# Patient Record
Sex: Female | Born: 1995 | Race: Black or African American | Hispanic: No | Marital: Single | Smoking: Never smoker
Health system: Southern US, Community
[De-identification: ages and names within clinical notes are randomized; demographics above are authoritative.]

## PROBLEM LIST (undated history)

## (undated) DIAGNOSIS — O24419 Gestational diabetes mellitus in pregnancy, unspecified control: Secondary | ICD-10-CM

## (undated) DIAGNOSIS — J45909 Unspecified asthma, uncomplicated: Secondary | ICD-10-CM

## (undated) DIAGNOSIS — S060XAA Concussion with loss of consciousness status unknown, initial encounter: Secondary | ICD-10-CM

## (undated) DIAGNOSIS — S060X9A Concussion with loss of consciousness of unspecified duration, initial encounter: Secondary | ICD-10-CM

## (undated) HISTORY — PX: NO PAST SURGERIES: SHX2092

---
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
## 2017-01-31 DIAGNOSIS — F1290 Cannabis use, unspecified, uncomplicated: Secondary | ICD-10-CM | POA: Insufficient documentation

## 2017-03-09 DIAGNOSIS — M419 Scoliosis, unspecified: Secondary | ICD-10-CM | POA: Insufficient documentation

## 2017-03-09 DIAGNOSIS — J45909 Unspecified asthma, uncomplicated: Secondary | ICD-10-CM | POA: Insufficient documentation

## 2018-07-06 ENCOUNTER — Other Ambulatory Visit: Payer: Self-pay

## 2018-07-06 ENCOUNTER — Emergency Department (HOSPITAL_COMMUNITY): Payer: No Typology Code available for payment source

## 2018-07-06 ENCOUNTER — Encounter (HOSPITAL_COMMUNITY): Payer: Self-pay | Admitting: Emergency Medicine

## 2018-07-06 ENCOUNTER — Emergency Department (HOSPITAL_COMMUNITY)
Admission: EM | Admit: 2018-07-06 | Discharge: 2018-07-06 | Disposition: A | Discharge status: Home / Self Care | Payer: No Typology Code available for payment source | Attending: Emergency Medicine | Admitting: Emergency Medicine

## 2018-07-06 DIAGNOSIS — Y9389 Activity, other specified: Secondary | ICD-10-CM | POA: Insufficient documentation

## 2018-07-06 DIAGNOSIS — Y929 Unspecified place or not applicable: Secondary | ICD-10-CM | POA: Diagnosis not present

## 2018-07-06 DIAGNOSIS — Y999 Unspecified external cause status: Secondary | ICD-10-CM | POA: Insufficient documentation

## 2018-07-06 DIAGNOSIS — T148XXA Other injury of unspecified body region, initial encounter: Secondary | ICD-10-CM

## 2018-07-06 DIAGNOSIS — S39012A Strain of muscle, fascia and tendon of lower back, initial encounter: Secondary | ICD-10-CM | POA: Insufficient documentation

## 2018-07-06 MED ORDER — METHOCARBAMOL 500 MG PO TABS: 500.0000 mg | ORAL_TABLET | Freq: Two times a day (BID) | ORAL | 0 refills | Status: DC
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
## 2018-07-06 NOTE — ED Notes (Signed)
Pt left for xray

## 2018-07-06 NOTE — ED Notes (Signed)
Patient given discharge instructions and verbalized understanding.  Patient stable to discharge at this time.  Patient is alert and oriented to baseline.  No distressed noted at this time.  All belongings taken with the patient at discharge.   

## 2018-07-06 NOTE — Discharge Instructions (Signed)
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
## 2020-06-19 ENCOUNTER — Other Ambulatory Visit: Payer: Self-pay

## 2020-06-19 ENCOUNTER — Encounter (HOSPITAL_COMMUNITY): Payer: Self-pay

## 2020-06-19 ENCOUNTER — Emergency Department (HOSPITAL_COMMUNITY): Payer: 59

## 2020-06-19 ENCOUNTER — Emergency Department (HOSPITAL_COMMUNITY)
Admission: EM | Admit: 2020-06-19 | Discharge: 2020-06-19 | Disposition: A | Discharge status: Home / Self Care | Payer: 59 | Attending: Emergency Medicine | Admitting: Emergency Medicine

## 2020-06-19 DIAGNOSIS — R42 Dizziness and giddiness: Secondary | ICD-10-CM | POA: Diagnosis not present

## 2020-06-19 DIAGNOSIS — R519 Headache, unspecified: Secondary | ICD-10-CM | POA: Diagnosis not present

## 2020-06-19 DIAGNOSIS — Z5321 Procedure and treatment not carried out due to patient leaving prior to being seen by health care provider: Secondary | ICD-10-CM | POA: Insufficient documentation

## 2020-06-19 DIAGNOSIS — R079 Chest pain, unspecified: Secondary | ICD-10-CM | POA: Diagnosis present

## 2020-06-19 HISTORY — DX: Unspecified asthma, uncomplicated: J45.909

## 2020-06-19 LAB — BASIC METABOLIC PANEL
Anion gap: 11 (ref 5–15)
BUN: 12 mg/dL (ref 6–20)
CO2: 18 mmol/L — ABNORMAL LOW (ref 22–32)
Calcium: 8.9 mg/dL (ref 8.9–10.3)
Chloride: 106 mmol/L (ref 98–111)
Creatinine, Ser: 1 mg/dL (ref 0.44–1.00)
GFR calc Af Amer: >60 mL/min (ref >60)
GFR calc non Af Amer: >60 mL/min (ref >60)
Glucose, Bld: 89 mg/dL (ref 70–99)
Potassium: 3.5 mmol/L (ref 3.5–5.1)
Sodium: 135 mmol/L (ref 135–145)

## 2020-06-19 LAB — CBC
HCT: 38.9 % (ref 36.0–46.0)
Hemoglobin: 13.4 g/dL (ref 12.0–15.0)
MCH: 32.4 pg (ref 26.0–34.0)
MCHC: 34.4 g/dL (ref 30.0–36.0)
MCV: 94.2 fL (ref 80.0–100.0)
Platelets: 187 10*3/uL (ref 150–400)
RBC: 4.13 MIL/uL (ref 3.87–5.11)
RDW: 12.8 % (ref 11.5–15.5)
WBC: 4 10*3/uL (ref 4.0–10.5)
nRBC: 0 % (ref 0.0–0.2)

## 2020-06-19 LAB — TROPONIN I (HIGH SENSITIVITY): Troponin I (High Sensitivity): <2 ng/L (ref <18)

## 2020-06-19 LAB — I-STAT BETA HCG BLOOD, ED (MC, WL, AP ONLY): I-stat hCG, quantitative: <5 m[IU]/mL (ref <5)

## 2020-06-19 NOTE — ED Triage Notes (Signed)
Patient c/o headache, chest pain, and dizziness since last night.

## 2020-06-20 ENCOUNTER — Telehealth: Payer: Self-pay

## 2020-06-20 NOTE — Telephone Encounter (Signed)
Pt wanting lab results. Was in ED yesterday. Labs have not been interpreted- Warm transferred to [facility] operator. Previously advised to ask for ED.

## 2020-06-22 ENCOUNTER — Telehealth (HOSPITAL_COMMUNITY): Payer: Self-pay

## 2020-09-09 ENCOUNTER — Other Ambulatory Visit: Payer: Self-pay

## 2020-09-09 ENCOUNTER — Inpatient Hospital Stay (HOSPITAL_COMMUNITY)
Admission: AD | Admit: 2020-09-09 | Discharge: 2020-09-09 | Disposition: A | Discharge status: Home / Self Care | Payer: 59 | Attending: Obstetrics and Gynecology | Admitting: Obstetrics and Gynecology

## 2020-09-09 ENCOUNTER — Encounter (HOSPITAL_COMMUNITY): Payer: Self-pay | Admitting: Obstetrics and Gynecology

## 2020-09-09 DIAGNOSIS — O219 Vomiting of pregnancy, unspecified: Secondary | ICD-10-CM | POA: Diagnosis present

## 2020-09-09 DIAGNOSIS — Z3A10 10 weeks gestation of pregnancy: Secondary | ICD-10-CM | POA: Diagnosis not present

## 2020-09-09 LAB — URINALYSIS, ROUTINE W REFLEX MICROSCOPIC
Bilirubin Urine: NEGATIVE
Glucose, UA: NEGATIVE mg/dL
Hgb urine dipstick: NEGATIVE
Ketones, ur: NEGATIVE mg/dL
Leukocytes,Ua: NEGATIVE
Nitrite: NEGATIVE
Protein, ur: NEGATIVE mg/dL
Specific Gravity, Urine: 1.024 (ref 1.005–1.030)
pH: 5 (ref 5.0–8.0)

## 2020-09-09 MED ORDER — ONDANSETRON 4 MG PO TBDP: 4.0000 mg | ORAL_TABLET | Freq: Four times a day (QID) | ORAL | 2 refills | Status: DC | PRN

## 2020-09-09 MED ORDER — ONDANSETRON 4 MG PO TBDP
8.0000 mg | ORAL_TABLET | Freq: Once | ORAL | Status: AC
  Administered 2020-09-09: 8 mg via ORAL
  Filled 2020-09-09: qty 2

## 2020-09-09 NOTE — MAU Note (Signed)
Presents with c/o N/V, reports can't keep anything down.  Reports unable hasn't been able to keep anything down for several weeks.  LMP 06/29/2020.  Denies VB.

## 2020-09-09 NOTE — MAU Provider Note (Signed)
History     CSN: [id number]  Arrival date and time: 09/09/20 1145   First Provider Initiated Contact with Patient 09/09/20 1229      Chief Complaint  Patient presents with  . Emesis  . Nausea   Diamond Forbes is a 24 y.o. G2P0 at [redacted]w[redacted]d who presents to MAU with complaints of nausea and emesis. Patient reports that N/V has been occurring for the past several weeks, is currently not on any medication for N/V- patient reports occasionally using THC pen for N/V. Patient reports last use last night. Patient reports 4 occurrences of emesis over the past 24 hours. She reports that she is unable to keep anything down, but reports that she last ate dinner last night. She denies any abdominal pain, vaginal bleeding, or discharge. She reports being seen at [facility] for ultrasound but unable to continue to go due to [facility]. Plans to go to [facility].   OB History    Gravida  2   Para      Term      Preterm      AB  1   Living  0     SAB  1   TAB      Ectopic      Multiple      Live Births  0           Past Medical History:  Diagnosis Date  . Asthma     Past Surgical History:  Procedure Laterality Date  . NO PAST SURGERIES      Family History  Problem Relation Age of Onset  . Healthy Mother   . Healthy Father     Social History   Tobacco Use  . Smoking status: Never Smoker  . Smokeless tobacco: Never Used  Vaping Use  . Vaping Use: Some days  . Substances: THC  Substance Use Topics  . Alcohol use: Not Currently    Comment: Socially  . Drug use: Yes    Types: Marijuana    Comment: last smoked 09/05/20    Allergies:  Allergies  Allergen Reactions  . Keflex [Cephalexin]     Medications Prior to Admission  Medication Sig Dispense Refill Last Dose  . methocarbamol (ROBAXIN) 500 MG tablet Take 1 tablet (500 mg total) by mouth 2 (two) times daily. 20 tablet 0     Review of Systems  Constitutional: Negative.   Respiratory: Negative.    Cardiovascular: Negative.   Gastrointestinal: Positive for nausea and vomiting. Negative for abdominal pain, constipation and diarrhea.  Genitourinary: Negative.   Musculoskeletal: Negative.   Neurological: Negative.   Psychiatric/Behavioral: Negative.    Physical Exam   Blood pressure 122/71, pulse 81, temperature 98.5 F (36.9 C), temperature source Oral, resp. rate 20, height 5\' 5"  (1.651 m), weight 57.6 kg, last menstrual period 06/29/2020, SpO2 100 %.  Physical Exam Vitals and nursing note reviewed.  HENT:     Head: Normocephalic.  Cardiovascular:     Rate and Rhythm: Normal rate and regular rhythm.  Pulmonary:     Effort: Pulmonary effort is normal. No respiratory distress.     Breath sounds: Normal breath sounds. No wheezing.  Abdominal:     General: There is no distension.     Palpations: Abdomen is soft. There is no mass.     Tenderness: There is no abdominal tenderness. There is no guarding.  Skin:    General: Skin is warm and dry.  Neurological:     Mental Status: She  is alert and oriented to person, place, and time.  Psychiatric:        Mood and Affect: Mood normal.        Behavior: Behavior normal.        Thought Content: Thought content normal.    FHR 155 by doppler   MAU Course  Procedures  MDM Results for orders placed or performed during the hospital encounter of 09/09/20 (from the past 24 hour(s))  Urinalysis, Routine w reflex microscopic Urine, Clean Catch     Status: Abnormal   Collection Time: 09/09/20 12:00 PM  Result Value Ref Range   Color, Urine YELLOW YELLOW   APPearance CLOUDY (A) CLEAR   Specific Gravity, Urine 1.024 1.005 - 1.030   pH 5.0 5.0 - 8.0   Glucose, UA NEGATIVE NEGATIVE mg/dL   Hgb urine dipstick NEGATIVE NEGATIVE   Bilirubin Urine NEGATIVE NEGATIVE   Ketones, ur NEGATIVE NEGATIVE mg/dL   Protein, ur NEGATIVE NEGATIVE mg/dL   Nitrite NEGATIVE NEGATIVE   Leukocytes,Ua NEGATIVE NEGATIVE   UA negative for ketones- no IV  hydration ordered . Zofran ODT ordered.  Reassessment after medication - patient able to tolerate gingerale and graham crackers after zofran.   Discussed reasons to return to MAU. Follow up as scheduled in the office. Return to MAU as needed. Pt stable at time of discharge. Rx for zofran sent to pharmacy of choice.   Assessment and Plan   1. Nausea and vomiting in pregnancy prior to [redacted] weeks gestation   2. [redacted] weeks gestation of pregnancy    Discharge home Follow up as scheduled in the office for prenatal care Return to MAU as needed for reasons discussed and/or emergencies  Rx for zofran    Follow-up Information    [facility] Follow up.   Specialty: Obstetrics and Gynecology Contact information: [address] [place] [address] [phone]             Allergies as of 09/09/2020      Reactions   Keflex [cephalexin]       Medication List    STOP taking these medications   methocarbamol 500 MG tablet Commonly known as: ROBAXIN     TAKE these medications   ondansetron 4 MG disintegrating tablet Commonly known as: Zofran ODT Take 1 tablet (4 mg total) by mouth every 6 (six) hours as needed for nausea or vomiting.       Sharyon Cable CNM 09/09/2020, 1:33 PM

## 2020-09-17 ENCOUNTER — Encounter: Payer: Self-pay | Admitting: Family Medicine

## 2020-09-17 ENCOUNTER — Other Ambulatory Visit (HOSPITAL_COMMUNITY)
Admission: RE | Admit: 2020-09-17 | Discharge: 2020-09-17 | Disposition: A | Discharge status: Home / Self Care | Payer: 59 | Source: Ambulatory Visit | Attending: Family Medicine | Admitting: Family Medicine

## 2020-09-17 ENCOUNTER — Ambulatory Visit (INDEPENDENT_AMBULATORY_CARE_PROVIDER_SITE_OTHER): Payer: 59 | Admitting: Family Medicine

## 2020-09-17 ENCOUNTER — Other Ambulatory Visit: Payer: Self-pay

## 2020-09-17 VITALS — BP 118/78 | HR 89 | Wt 128.0 lb

## 2020-09-17 DIAGNOSIS — Z3A11 11 weeks gestation of pregnancy: Secondary | ICD-10-CM | POA: Diagnosis not present

## 2020-09-17 DIAGNOSIS — Z3480 Encounter for supervision of other normal pregnancy, unspecified trimester: Secondary | ICD-10-CM

## 2020-09-17 LAB — OB RESULTS CONSOLE GC/CHLAMYDIA: Gonorrhea: NEGATIVE

## 2020-09-17 MED ORDER — ONDANSETRON 4 MG PO TBDP: 4.0000 mg | ORAL_TABLET | Freq: Three times a day (TID) | ORAL | 2 refills | Status: DC | PRN

## 2020-09-17 NOTE — Progress Notes (Signed)
  Subjective:  Diamond Forbes is a G2P0010 [redacted]w[redacted]d being seen today for her first obstetrical visit.  Her obstetrical history is significant for first pregnancy. FOB involved. Unexpected, but desired . Patient does intend to breast feed. Pregnancy history fully reviewed.  Patient reports nausea.  BP 118/78   Pulse 89   Wt 128 lb (58.1 kg)   LMP 06/29/2020   BMI 21.30 kg/m   HISTORY: OB History  Gravida Para Term Preterm AB Living  2       1 0  SAB TAB Ectopic Multiple Live Births  1       0    # Outcome Date GA Lbr Len/2nd Weight Sex Delivery Anes PTL Lv  2 Current           1 SAB             Past Medical History:  Diagnosis Date  . Asthma     Past Surgical History:  Procedure Laterality Date  . NO PAST SURGERIES      Family History  Problem Relation Age of Onset  . Healthy Mother   . Healthy Father      Exam  BP 118/78   Pulse 89   Wt 128 lb (58.1 kg)   LMP 06/29/2020   BMI 21.30 kg/m   Chaperone present during exam  CONSTITUTIONAL: Well-developed, well-nourished female in no acute distress.  HENT:  Normocephalic, atraumatic, External right and left ear normal. Oropharynx is clear and moist EYES: Conjunctivae and EOM are normal. Pupils are equal, round, and reactive to light. No scleral icterus.  NECK: Normal range of motion, supple, no masses.  Normal thyroid.  CARDIOVASCULAR: Normal heart rate noted, regular rhythm RESPIRATORY: Clear to auscultation bilaterally. Effort and breath sounds normal, no problems with respiration noted. BREASTS: Symmetric in size. No masses, skin changes, nipple drainage, or lymphadenopathy. ABDOMEN: Soft, normal bowel sounds, no distention noted.  No tenderness, rebound or guarding.  PELVIC: Normal appearing external genitalia; normal appearing vaginal mucosa and cervix. No abnormal discharge noted. Normal uterine size, no other palpable masses, no uterine or adnexal tenderness. MUSCULOSKELETAL: Normal range of motion.  No tenderness.  No cyanosis, clubbing, or edema.  2+ distal pulses. SKIN: Skin is warm and dry. No rash noted. Not diaphoretic. No erythema. No pallor. NEUROLOGIC: Alert and oriented to person, place, and time. Normal reflexes, muscle tone coordination. No cranial nerve deficit noted. PSYCHIATRIC: Normal mood and affect. Normal behavior. Normal judgment and thought content.    Assessment:    Pregnancy: G2P0010 Patient Active Problem List   Diagnosis Date Noted  . Supervision of other normal pregnancy, antepartum 09/17/2020      Plan:   1. Supervision of other normal pregnancy, antepartum Discussed Panorama - will think about this. [place] scheduled. Discussed delivery at [facility] hospital Discussed midwives, fellows. - [place] MFM OB COMP + 14 WK; Future - [facility] AMB BABYSCRIPTS OPT IN - Cytology - PAP( [place])  2. [redacted] weeks gestation of pregnancy - [place] MFM OB COMP + 14 WK; Future - [facility] AMB BABYSCRIPTS OPT IN - Cytology - PAP( [place])     Problem list reviewed and updated. 75% of 30 min visit spent on counseling and coordination of care.     Levie Heritage 09/17/2020

## 2020-09-17 NOTE — Progress Notes (Signed)
DATING AND VIABILITY SONOGRAM   Vanissa Nichols-Stephens is a 24 y.o. year old G2P0010 with LMP Patient's last menstrual period was 06/29/2020. which would correlate to  [redacted]w[redacted]d weeks gestation.  She has regular menstrual cycles.   She is here today for a confirmatory initial sonogram.    GESTATION: SINGLETON  FETAL ACTIVITY:          Heart rate      160          The fetus is active.  ADNEXA: The ovaries are normal.   GESTATIONAL AGE AND  BIOMETRICS:  Gestational criteria: Estimated Date of Delivery: 04/05/21 by LMP now at [redacted]w[redacted]d  Previous Scans:0      CROWN RUMP LENGTH           3.80 cm         10-6weeks                                                                               AVERAGE EGA(BY THIS SCAN):  10-6 weeks  WORKING EDD( LMP ):  04-05-2021     TECHNICIAN COMMENTS: Patient informed that the ultrasound is considered a limited obstetric ultrasound and is not intended to be a complete ultrasound exam. Patient also informed that the ultrasound is not being completed with the intent of assessing for fetal or placental anomalies or any pelvic abnormalities. Explained that the purpose of today's ultrasound is to assess for fetal heart rate. Patient acknowledges the purpose of the exam and the limitations of the study.      Armandina Stammer 09/17/2020 10:46 AM

## 2020-09-21 LAB — CYTOLOGY - PAP
Chlamydia: NEGATIVE
Comment: NEGATIVE
Comment: NORMAL
Diagnosis: NEGATIVE
Neisseria Gonorrhea: NEGATIVE

## 2020-10-20 ENCOUNTER — Other Ambulatory Visit: Payer: Self-pay

## 2020-10-20 ENCOUNTER — Encounter: Payer: Self-pay | Admitting: Advanced Practice Midwife

## 2020-10-20 ENCOUNTER — Ambulatory Visit (INDEPENDENT_AMBULATORY_CARE_PROVIDER_SITE_OTHER): Payer: 59 | Admitting: Advanced Practice Midwife

## 2020-10-20 DIAGNOSIS — Z3480 Encounter for supervision of other normal pregnancy, unspecified trimester: Secondary | ICD-10-CM

## 2020-10-20 NOTE — Patient Instructions (Signed)
Genetic Testing During Pregnancy Genetic testing during pregnancy is also called prenatal genetic testing. This type of testing can determine if your baby is at risk of being born with a disorder caused by abnormal genes or chromosomes (genetic disorder). Chromosomes contain genes that control how your baby will develop in your womb. There are many different genetic disorders. Examples of genetic disorders that may be found through genetic testing include Down syndrome and cystic fibrosis. Gene changes (mutations) can be passed down through families. Genetic testing is offered to all women before or during pregnancy. You can choose whether to have genetic testing. Why is genetic testing done? Genetic testing is done during pregnancy to find out whether your child is at risk for a genetic disorder. Having genetic testing allows you to:  Discuss your test results and options with a genetic counselor.  Prepare for a baby that may be born with a genetic disorder. Learning about the disorder ahead of time helps you be better prepared to manage it. Your health care providers can also be prepared in case your baby requires special care before or after birth.  Consider whether you want to continue with the pregnancy. In some cases, genetic testing may be done to learn about the traits a child will inherit. Types of genetic tests There are two basic types of genetic testing. Screening tests indicate whether your developing baby (fetus) is at higher risk for a genetic disorder. Diagnostic tests check actual fetal cells to diagnose a genetic disorder. Screening tests     Screening tests will not harm your baby. They are recommended for all pregnant women. Types of screening tests include:  Carrier screening. This test involves checking genes from both parents by testing their blood or saliva. The test checks to find out if the parents carry a genetic mutation that may be passed to a baby. In most cases,  both parents must carry the mutation for a baby to be at risk.  First trimester screening. This test combines a blood test with sound wave imaging of your baby (fetal ultrasound). This screening test checks for a risk of Down syndrome or other defects caused by having extra chromosomes. It also checks for defects of the heart, abdomen, or skeleton.  Second trimester screening also combines a blood test with a fetal ultrasound exam. It checks for a risk of genetic defects of the face, brain, spine, heart, or limbs.  Combined or sequential screening. This type of testing combines the results of first and second trimester screening. This type of testing may be more accurate than first or second trimester screening alone.  Cell-free DNA testing. This is a blood test that detects cells released by the placenta that get into the mother's blood. It can be used to check for a risk of Down syndrome, other extra chromosome syndromes, and disorders caused by abnormal numbers of sex chromosomes. This test can be done any time after 10 weeks of pregnancy.  Diagnostic tests Diagnostic tests carry slight risks of problems, including bleeding, infection, and loss of the pregnancy. These tests are done only if your baby is at risk for a genetic disorder. You may meet with a genetic counselor to discuss the risks and benefits before having diagnostic tests. Examples of diagnostic tests include:  Chorionic villus sampling (CVS). This involves a procedure to remove and test a sample of cells taken from the placenta. The procedure may be done between 10 and 12 weeks of pregnancy.  Amniocentesis. This involves a   procedure to remove and test a sample of fluid (amniotic fluid) and cells from the sac that surrounds the developing baby. The procedure may be done between 15 and 20 weeks of pregnancy. What do the results mean? For a screening test:  If the results are negative, it often means that your child is not at higher  risk. There is still a slight chance your child could have a genetic disorder.  If the results are positive, it does not mean your child will have a genetic disorder. It may mean that your child has a higher-than-normal risk for a genetic disorder. In that case, you may want to talk with a genetic counselor about whether you should have diagnostic genetic tests. For a diagnostic test:  If the result is negative, it is unlikely that your child will have a genetic disorder.  If the test is positive for a genetic disorder, it is likely that your child will have the disorder. The test may not tell how severe the disorder will be. Talk with your health care provider about your options. Questions to ask your health care provider Before talking to your health care provider about genetic testing, find out if there is a history of genetic disorders in your family. It may also help to know your family's ethnic origins. Then ask your health care provider the following questions:  Is my baby at risk for a genetic disorder?  What are the benefits of having genetic screening?  What tests are best for me and my baby?  What are the risks of each test?  If I get a positive result on a screening test, what is the next step?  Should I meet with a genetic counselor before having a diagnostic test?  Should my partner or other members of my family be tested?  How much do the tests cost? Will my insurance cover the testing? Summary  Genetic testing is done during pregnancy to find out whether your child is at risk for a genetic disorder.  Genetic testing is offered to all women before or during pregnancy. You can choose whether to have genetic testing.  There are two basic types of genetic testing. Screening tests indicate whether your developing baby (fetus) is at higher risk for a genetic disorder. Diagnostic tests check actual fetal cells to diagnose a genetic disorder.  If a diagnostic genetic test is  positive, talk with your health care provider about your options. This information is not intended to replace advice given to you by your health care provider. Make sure you discuss any questions you have with your health care provider. Document Revised: 02/14/2019 Document Reviewed: 01/08/2018 Elsevier Patient Education  2020 [employer] Inc[employer]  

## 2020-10-20 NOTE — Addendum Note (Signed)
Addended by: Anell Barr on: 10/20/2020 11:22 AM   Modules accepted: Orders

## 2020-10-20 NOTE — Progress Notes (Signed)
   PRENATAL VISIT NOTE  Subjective:  Diamond Forbes is a 24 y.o. G2P0010 at [redacted]w[redacted]d being seen today for ongoing prenatal care.  She is currently monitored for the following issues for this low-risk pregnancy and has Supervision of other normal pregnancy, antepartum; Marijuana use; Mild asthma; and Scoliosis on their problem list.  Patient reports no complaints.  Contractions: Not present. Vag. Bleeding: None.  Movement: Absent. Denies leaking of fluid.   The following portions of the patient's history were reviewed and updated as appropriate: allergies, current medications, past family history, past medical history, past social history, past surgical history and problem list.   Objective:   Vitals:   10/20/20 1008  BP: 113/69  Pulse: 78  Weight: 138 lb (62.6 kg)    Fetal Status:     Movement: Absent     General:  Alert, oriented and cooperative. Patient is in no acute distress.  Skin: Skin is warm and dry. No rash noted.   Cardiovascular: Normal heart rate noted  Respiratory: Normal respiratory effort, no problems with respiration noted  Abdomen: Soft, gravid, appropriate for gestational age.  Pain/Pressure: Absent     Pelvic: Cervical exam deferred        Extremities: Normal range of motion.  Edema: None  Mental Status: Normal mood and affect. Normal behavior. Normal judgment and thought content.   Assessment and Plan:  Pregnancy: G2P0010 at [redacted]w[redacted]d 1. Supervision of other normal pregnancy, antepartum     Decided to do Panorama test     Anatomy US scheduled for 11/12/20 - Urine Culture - CBC/D/Plt+RPR+Rh+ABO+Rub Ab... - Genetic Screening  Preterm labor symptoms and general obstetric precautions including but not limited to vaginal bleeding, contractions, leaking of fluid and fetal movement were reviewed in detail with the patient. Please refer to After Visit Summary for other counseling recommendations.   Return in about 4 weeks (around 11/17/2020) for [facility].  Future Appointments  Date Time Provider Department Center  11/12/2020 10:45 AM [facility] US5 [facility]  11/17/2020  9:30 AM Aviva Signs, CNM [facility] None    Wynelle Bourgeois, CNM

## 2020-10-21 LAB — CBC/D/PLT+RPR+RH+ABO+RUB AB...
Antibody Screen: NEGATIVE
Basophils Absolute: 0 10*3/uL (ref 0.0–0.2)
Basos: 0 %
EOS (ABSOLUTE): 0.2 10*3/uL (ref 0.0–0.4)
Eos: 4 %
HCV Ab: <0.1 s/co ratio (ref 0.0–0.9)
HIV Screen 4th Generation wRfx: NONREACTIVE
Hematocrit: 35.8 % (ref 34.0–46.6)
Hemoglobin: 12.1 g/dL (ref 11.1–15.9)
Hepatitis B Surface Ag: NEGATIVE
Immature Grans (Abs): 0 10*3/uL (ref 0.0–0.1)
Immature Granulocytes: 1 %
Lymphocytes Absolute: 1.4 10*3/uL (ref 0.7–3.1)
Lymphs: 28 %
MCH: 31.7 pg (ref 26.6–33.0)
MCHC: 33.8 g/dL (ref 31.5–35.7)
MCV: 94 fL (ref 79–97)
Monocytes Absolute: 0.4 10*3/uL (ref 0.1–0.9)
Monocytes: 8 %
Neutrophils Absolute: 2.8 10*3/uL (ref 1.4–7.0)
Neutrophils: 59 %
Platelets: 188 10*3/uL (ref 150–450)
RBC: 3.82 x10E6/uL (ref 3.77–5.28)
RDW: 12.9 % (ref 11.7–15.4)
RPR Ser Ql: NONREACTIVE
Rh Factor: POSITIVE
Rubella Antibodies, IGG: 2.71 index (ref >0.99)
WBC: 4.8 10*3/uL (ref 3.4–10.8)

## 2020-10-21 LAB — HCV INTERPRETATION

## 2020-11-07 NOTE — L&D Delivery Note (Signed)
Patient: Diamond Forbes MRN: [id number]  GBS status: Negative, IAP given: None   Patient is a 25 y.o. now G2P1 s/p NSVD at [redacted]w[redacted]d, who was admitted for IOL for A1GDM. AROM 2h 30m prior to delivery with clear fluid.    Delivery Note At 3:16 PM a viable female was delivered via Vaginal, Spontaneous (Presentation: Right Occiput Anterior).  APGAR: 7, 8; weight pending.   Placenta status: Spontaneous, Intact.  Cord: 3 vessels with the following complications: None.    Head delivered ROA. No nuchal cord present. At this point, shoulder dystocia was noted. Shoulder dystocia lasted one minute. McRobert's and suprapubic pressure were applied. Was able to hook left anterior shoulder and sweep for delivery. Infant with spontaneous cry, placed on mother's abdomen, dried and bulb suctioned. Cord clamped x 2 after 2-minute delay, and cut by family member. Cord blood drawn. Placenta delivered spontaneously with gentle cord traction. Fundus noted to be boggy. Pitocin was started followed by TXA and Methergine. A lower uterine sweep was performed, yielding several large clots. Fundus firm with these interventions. Perineum inspected and found to have small first degree laceration, which was found to be hemostatic.  Anesthesia: Epidural Episiotomy: None Lacerations: First degree perineal  Suture Repair: N/A Est. Blood Loss (mL): 700  Mom to postpartum.  Baby to Couplet care / Skin to Skin.  De Hollingshead 03/23/2021, 4:19 PM

## 2020-11-12 ENCOUNTER — Ambulatory Visit: Payer: 59

## 2020-11-13 ENCOUNTER — Ambulatory Visit: Payer: 59 | Attending: Family

## 2020-11-13 DIAGNOSIS — Z23 Encounter for immunization: Secondary | ICD-10-CM

## 2020-11-17 ENCOUNTER — Encounter: Payer: Self-pay | Admitting: Advanced Practice Midwife

## 2020-11-17 ENCOUNTER — Other Ambulatory Visit: Payer: Self-pay

## 2020-11-17 ENCOUNTER — Ambulatory Visit (INDEPENDENT_AMBULATORY_CARE_PROVIDER_SITE_OTHER): Payer: 59 | Admitting: Advanced Practice Midwife

## 2020-11-17 VITALS — BP 110/68 | HR 78 | Wt 148.0 lb

## 2020-11-17 DIAGNOSIS — Z3480 Encounter for supervision of other normal pregnancy, unspecified trimester: Secondary | ICD-10-CM

## 2020-11-17 DIAGNOSIS — Z3A20 20 weeks gestation of pregnancy: Secondary | ICD-10-CM

## 2020-11-17 NOTE — Progress Notes (Signed)
   PRENATAL VISIT NOTE  Subjective:  Diamond Forbes is a 25 y.o. G2P0010 at [redacted]w[redacted]d being seen today for ongoing prenatal care.  She is currently monitored for the following issues for this low-risk pregnancy and has Supervision of other normal pregnancy, antepartum; Marijuana use; Mild asthma; and Scoliosis on their problem list.  Patient reports no complaints.  Contractions: Not present. Vag. Bleeding: None.  Movement: Present. Denies leaking of fluid.   The following portions of the patient's history were reviewed and updated as appropriate: allergies, current medications, past family history, past medical history, past social history, past surgical history and problem list.   Objective:   Vitals:   11/17/20 0945  BP: 110/68  Pulse: 78  Weight: 148 lb (67.1 kg)    Fetal Status: Fetal Heart Rate (bpm): 148   Movement: Present     General:  Alert, oriented and cooperative. Patient is in no acute distress.  Skin: Skin is warm and dry. No rash noted.   Cardiovascular: Normal heart rate noted  Respiratory: Normal respiratory effort, no problems with respiration noted  Abdomen: Soft, gravid, appropriate for gestational age.  Pain/Pressure: Absent     Pelvic: Cervical exam deferred        Extremities: Normal range of motion.  Edema: None  Mental Status: Normal mood and affect. Normal behavior. Normal judgment and thought content.   Assessment and Plan:  Pregnancy: G2P0010 at [redacted]w[redacted]d 1. Supervision of other normal pregnancy, antepartum      Got Covid Vaccine last week (school requirement)      Sore arm kept her up that night      Feeling fetal movement now.  2. 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 Please refer to After Visit Summary for other counseling recommendations.   Return in about 4 weeks (around 12/15/2020) for [facility].  Future Appointments  Date Time Provider Department Center  11/27/2020  3:45 PM [facility] US4 [facility]  12/15/2020  9:35 AM Aviva Signs, CNM [facility] None    Wynelle Bourgeois, CNM

## 2020-11-17 NOTE — Patient Instructions (Signed)

## 2020-11-27 ENCOUNTER — Ambulatory Visit: Payer: 59 | Attending: Family Medicine

## 2020-11-27 ENCOUNTER — Other Ambulatory Visit: Payer: Self-pay

## 2020-11-27 DIAGNOSIS — Z3A11 11 weeks gestation of pregnancy: Secondary | ICD-10-CM | POA: Diagnosis present

## 2020-11-27 DIAGNOSIS — Z3480 Encounter for supervision of other normal pregnancy, unspecified trimester: Secondary | ICD-10-CM | POA: Diagnosis present

## 2020-12-15 ENCOUNTER — Other Ambulatory Visit: Payer: Self-pay

## 2020-12-15 ENCOUNTER — Ambulatory Visit (INDEPENDENT_AMBULATORY_CARE_PROVIDER_SITE_OTHER): Payer: 59 | Admitting: Advanced Practice Midwife

## 2020-12-15 VITALS — BP 110/72 | HR 89 | Wt 162.0 lb

## 2020-12-15 DIAGNOSIS — Z3480 Encounter for supervision of other normal pregnancy, unspecified trimester: Secondary | ICD-10-CM

## 2020-12-15 DIAGNOSIS — O1202 Gestational edema, second trimester: Secondary | ICD-10-CM

## 2020-12-15 NOTE — Progress Notes (Signed)
   PRENATAL VISIT NOTE  Subjective:  Diamond Forbes is a 25 y.o. G2P0010 at [redacted]w[redacted]d being seen today for ongoing prenatal care.  She is currently monitored for the following issues for this low-risk pregnancy and has Supervision of other normal pregnancy, antepartum; Marijuana use; Mild asthma; and Scoliosis on their problem list.  Patient reports Feet swelling due to prolonged standing at work, having trouble walking long distances at school.  Contractions: Not present. Vag. Bleeding: None.  Movement: Present. Denies leaking of fluid.   The following portions of the patient's history were reviewed and updated as appropriate: allergies, current medications, past family history, past medical history, past social history, past surgical history and problem list.   Objective:   Vitals:   12/15/20 0951  BP: 110/72  Pulse: 89  Weight: 162 lb (73.5 kg)    Fetal Status: Fetal Heart Rate (bpm): 150   Movement: Present     General:  Alert, oriented and cooperative. Patient is in no acute distress.  Skin: Skin is warm and dry. No rash noted.   Cardiovascular: Normal heart rate noted  Respiratory: Normal respiratory effort, no problems with respiration noted  Abdomen: Soft, gravid, appropriate for gestational age.  Pain/Pressure: Present     Pelvic: Cervical exam deferred        Extremities: Normal range of motion.  Edema: None  Mental Status: Normal mood and affect. Normal behavior. Normal judgment and thought content.   Assessment and Plan:  Pregnancy: G2P0010 at [redacted]w[redacted]d 1. Supervision of other normal pregnancy, antepartum Letter given to provide for changing positions at work, allow for breaks as needed Filled out form for temporary (33mo) parking pass for school Glucose tolerance test next visit  2. Edema during pregnancy in second trimester Discussed compression socks to wear daily  Preterm labor symptoms and general obstetric precautions including but not limited to vaginal  bleeding, contractions, leaking of fluid and fetal movement were reviewed in detail with the patient. Please refer to After Visit Summary for other counseling recommendations.   No follow-ups on file.  Future Appointments  Date Time Provider Department Center  01/14/2021  8:30 AM Levie Heritage, DO [facility] None  01/28/2021 10:00 AM Levie Heritage, DO [facility] None    Wynelle Bourgeois, CNM

## 2020-12-27 ENCOUNTER — Other Ambulatory Visit: Payer: Self-pay

## 2020-12-27 ENCOUNTER — Inpatient Hospital Stay (HOSPITAL_COMMUNITY)
Admission: AD | Admit: 2020-12-27 | Discharge: 2020-12-27 | Disposition: A | Discharge status: Home / Self Care | Payer: 59 | Attending: Obstetrics & Gynecology | Admitting: Obstetrics & Gynecology

## 2020-12-27 ENCOUNTER — Encounter (HOSPITAL_COMMUNITY): Payer: Self-pay | Admitting: Obstetrics & Gynecology

## 2020-12-27 DIAGNOSIS — O368120 Decreased fetal movements, second trimester, not applicable or unspecified: Secondary | ICD-10-CM

## 2020-12-27 DIAGNOSIS — Z3689 Encounter for other specified antenatal screening: Secondary | ICD-10-CM

## 2020-12-27 DIAGNOSIS — Z881 Allergy status to other antibiotic agents status: Secondary | ICD-10-CM | POA: Diagnosis not present

## 2020-12-27 DIAGNOSIS — Z3A25 25 weeks gestation of pregnancy: Secondary | ICD-10-CM | POA: Diagnosis not present

## 2020-12-27 NOTE — Discharge Instructions (Signed)
How a Baby Grows During Pregnancy Pregnancy begins when a female's sperm enters a female's egg. This is called fertilization. Fertilization usually happens in one of the fallopian tubes that connect the ovaries to the uterus. The fertilized egg moves down the fallopian tube to the uterus. Once it reaches the uterus, it implants into the lining of the uterus and begins to grow. For the first 8 weeks, the fertilized egg is called an embryo. After 8 weeks, it is called a fetus. As the fetus continues to grow, it receives oxygen and nutrients through the placenta, which is an organ that grows to support the developing baby. The placenta is the life support system for the baby. It provides oxygen and nutrition and removes waste. How long does a typical pregnancy last? A pregnancy usually lasts 280 days, or about 40 weeks. Pregnancy is divided into three periods of growth, also called trimesters:  First trimester: 0-12 weeks.  Second trimester: 13-27 weeks.  Third trimester: 28-40 weeks. The day when your baby is ready to be born (full term) is your estimated date of delivery. However, most babies are not born on their estimated date of delivery. How does my baby develop month by month? First month  The fertilized egg attaches to the inside of the uterus.  Some cells will form the placenta. Others will form the fetus.  The arms, legs, brain, spinal cord, lungs, and heart begin to develop.  At the end of the first month, the heart begins to beat. Second month  The bones, inner ear, eyelids, hands, and feet form.  The genitals develop.  By the end of 8 weeks, all major organs are developing. Third month  All of the internal organs are forming.  Teeth develop below the gums.  Bones and muscles begin to grow. The spine can flex.  The skin is transparent.  Fingernails and toenails begin to form.  Arms and legs continue to grow longer, and hands and feet develop.  The fetus is about 3  inches (7.6 cm) long. Fourth month  The placenta is completely formed.  The external sex organs, neck, outer ear, eyebrows, eyelids, and fingernails are formed.  The fetus can hear, swallow, and move its arms and legs.  The kidneys begin to produce urine.  The skin is covered with a white, waxy coating (vernix) and very fine hair (lanugo). Fifth month  The fetus moves around more and can be felt for the first time (quickening).  The fetus starts to sleep and wake up and may begin to suck a finger.  The nails grow to the end of the fingers.  The organ in the digestive system that makes bile (gallbladder) functions and helps to digest nutrients.  If the fetus is a female, eggs are present in the ovaries. If the fetus is a female, testicles start to move down into the scrotum. Sixth month  The lungs are formed.  The eyes open. The brain continues to develop.  Your baby has fingerprints and toe prints. Your baby's hair grows thicker.  At the end of the second trimester, the fetus is about 9 inches (22.9 cm) long. Seventh month  The fetus kicks and stretches.  The eyes are developed enough to sense changes in light.  The hands can make a grasping motion.  The fetus responds to sound. Eighth month  Most organs and body systems are fully developed and functioning.  Bones harden, and taste buds develop. The fetus may hiccup.  Certain areas  of the brain are still developing. The skull remains soft. Ninth month  The fetus gains about  lb (0.23 kg) each week.  The lungs are fully developed.  Patterns of sleep develop.  The fetus's head typically moves into a head-down position (vertex) in the uterus to prepare for birth.  The fetus weighs 6-9 lb (2.72-4.08 kg) and is 19-20 inches (48.26-50.8 cm) long.   How do I know if my baby is developing well? Always talk with your health care provider about any concerns that you may have about your pregnancy and your baby. At each  prenatal visit, your health care provider will do several different tests to check on your health and keep track of your baby's development. These include:  Fundal height and position. To do this, your health care provider will: ? Measure your growing belly from your pubic bone to the top of the uterus using a tape measure. ? Feel your belly to determine your baby's position.  Heartbeat. An ultrasound in the first trimester can confirm pregnancy and show a heartbeat, depending on how far along you are. Your health care provider will check your baby's heart rate at every prenatal visit. You will also have a second trimester ultrasound to check your baby's development. Follow these instructions at home:  Take prenatal vitamins as told by your health care provider. These include vitamins such as folic acid, iron, calcium, and vitamin D. They are important for healthy development.  Take over-the-counter and prescription medicines only as told by your health care provider.  Keep all follow-up visits. This is important. Follow-up visits include prenatal care and screening tests. Summary  A pregnancy usually lasts 280 days, or about 40 weeks. Pregnancy is divided into three periods of growth, also called trimesters.  Your health care provider will monitor your baby's growth and development throughout your pregnancy.  Follow your health care provider's recommendations about taking prenatal vitamins and medicines during your pregnancy.  Talk with your health care provider if you have any concerns about your pregnancy or your developing baby. This information is not intended to replace advice given to you by your health care provider. Make sure you discuss any questions you have with your health care provider. Document Revised: 04/01/2020 Document Reviewed: 02/06/2020 Elsevier Patient Education  2021 [employer].

## 2020-12-27 NOTE — MAU Provider Note (Signed)
History     CSN: [id number]  Arrival date and time: 12/27/20 4235   None     Chief Complaint  Patient presents with  . Decreased Fetal Movement   HPI Patient Diamond Forbes is a 25 y.o. G2P0010  at [redacted]w[redacted]d here with complaints of decreased fetal movements. She denies bleeding, LOF, abnormal discharge.  She denies contractions, fever, SOB, dysuria.  She reports decreased fetal movements since yesterday morning which lasted into the day and overnight.  She was concerned because she tried making her move this morning: she tried playing with belly, pushing on her stomach.  She tried drinking water and eating, and she didn't move after patient was eating.   Patient called OB nurse line and the nurse told her to come in for evaluation.    OB History    Gravida  2   Para      Term      Preterm      AB  1   Living  0     SAB  1   IAB      Ectopic      Multiple      Live Births  0           Past Medical History:  Diagnosis Date  . Asthma     Past Surgical History:  Procedure Laterality Date  . NO PAST SURGERIES      Family History  Problem Relation Age of Onset  . Healthy Mother   . Healthy Father     Social History   Tobacco Use  . Smoking status: Never Smoker  . Smokeless tobacco: Never Used  Vaping Use  . Vaping Use: Some days  . Substances: THC  Substance Use Topics  . Alcohol use: Not Currently    Comment: Socially  . Drug use: Yes    Types: Marijuana    Comment: last smoked 09/05/20    Allergies:  Allergies  Allergen Reactions  . Cephalexin Diarrhea and Rash    "severe diarrhea"    Medications Prior to Admission  Medication Sig Dispense Refill Last Dose  . cyclobenzaprine (FLEXERIL) 5 MG tablet Take by mouth.     . ondansetron (ZOFRAN ODT) 4 MG disintegrating tablet Take 1 tablet (4 mg total) by mouth every 8 (eight) hours as needed for nausea or vomiting. 60 tablet 2   . Prenatal Vit-Fe Fumarate-FA (PRENATAL  VITAMINS PO) Take by mouth.     . valACYclovir (VALTREX) 500 MG tablet Take 500 mg by mouth daily.       Review of Systems  Constitutional: Negative.   HENT: Negative.   Respiratory: Negative.   Cardiovascular: Negative.   Gastrointestinal: Negative for abdominal pain.  Genitourinary: Negative.   Musculoskeletal: Negative.   Neurological: Negative.    Physical Exam   Blood pressure 110/66, pulse 99, temperature 98.7 F (37.1 C), temperature source Oral, resp. rate 16, last menstrual period 06/29/2020.  Physical Exam Constitutional:      Appearance: Normal appearance. She is normal weight.  Abdominal:     General: Abdomen is flat.  Musculoskeletal:        General: Normal range of motion.  Skin:    General: Skin is warm.  Neurological:     General: No focal deficit present.     Mental Status: She is alert.  Psychiatric:        Mood and Affect: Mood normal.        Behavior: Behavior normal.  MAU Course  Procedures  MDM -NST; 150 bpm, mod var, present acel, no decels, uterine irratability -patient felt strong fetal movements while in MAU, no other complaints.  -No other work-up done while in MAU.  Assessment and Plan   1. NST (non-stress test) reactive    -Keep appt on 3-10 for glucose test -return to MAU if her condition changes; reviewed drinking cold drinks with sugar and assessing movements, although fetal kick counts are not relaible at this gestation.   [name] 12/27/2020, 10:02 AM

## 2020-12-27 NOTE — MAU Note (Signed)
Pt reports to mau with c/o dfm for the past few days.  Pt denies LOF, vag bleeding or ctx at this time.

## 2021-01-14 ENCOUNTER — Other Ambulatory Visit: Payer: Self-pay

## 2021-01-14 ENCOUNTER — Ambulatory Visit (INDEPENDENT_AMBULATORY_CARE_PROVIDER_SITE_OTHER): Payer: Medicaid Other | Admitting: Family Medicine

## 2021-01-14 ENCOUNTER — Other Ambulatory Visit (HOSPITAL_COMMUNITY)
Admission: RE | Admit: 2021-01-14 | Discharge: 2021-01-14 | Disposition: A | Discharge status: Home / Self Care | Payer: Medicaid Other | Source: Ambulatory Visit | Attending: Family Medicine | Admitting: Family Medicine

## 2021-01-14 VITALS — BP 104/69 | HR 89 | Wt 173.0 lb

## 2021-01-14 DIAGNOSIS — N898 Other specified noninflammatory disorders of vagina: Secondary | ICD-10-CM | POA: Diagnosis present

## 2021-01-14 DIAGNOSIS — O26893 Other specified pregnancy related conditions, third trimester: Secondary | ICD-10-CM | POA: Diagnosis not present

## 2021-01-14 DIAGNOSIS — Z3A28 28 weeks gestation of pregnancy: Secondary | ICD-10-CM

## 2021-01-14 DIAGNOSIS — Z3480 Encounter for supervision of other normal pregnancy, unspecified trimester: Secondary | ICD-10-CM

## 2021-01-14 DIAGNOSIS — D508 Other iron deficiency anemias: Secondary | ICD-10-CM

## 2021-01-14 LAB — CERVICOVAGINAL ANCILLARY ONLY
Bacterial Vaginitis (gardnerella): NEGATIVE
Candida Glabrata: NEGATIVE
Candida Vaginitis: POSITIVE — AB
Comment: NEGATIVE
Comment: NEGATIVE
Comment: NEGATIVE

## 2021-01-14 LAB — OB RESULTS CONSOLE RPR: RPR: NONREACTIVE

## 2021-01-14 LAB — OB RESULTS CONSOLE HIV ANTIBODY (ROUTINE TESTING): HIV: NONREACTIVE

## 2021-01-14 NOTE — Progress Notes (Signed)
   PRENATAL VISIT NOTE  Subjective:  Diamond Forbes is a 25 y.o. G2P0010 at [redacted]w[redacted]d being seen today for ongoing prenatal care.  She is currently monitored for the following issues for this low-risk pregnancy and has Supervision of other normal pregnancy, antepartum; Marijuana use; Mild asthma; and Scoliosis on their problem list.  Patient reports vaginal irritation.  Contractions: Not present. Vag. Bleeding: None.  Movement: Present. Denies leaking of fluid.   The following portions of the patient's history were reviewed and updated as appropriate: allergies, current medications, past family history, past medical history, past social history, past surgical history and problem list.   Objective:   Vitals:   01/14/21 0835  BP: 104/69  Pulse: 89  Weight: 173 lb (78.5 kg)    Fetal Status: Fetal Heart Rate (bpm): 159 Fundal Height: 28 cm Movement: Present     General:  Alert, oriented and cooperative. Patient is in no acute distress.  Skin: Skin is warm and dry. No rash noted.   Cardiovascular: Normal heart rate noted  Respiratory: Normal respiratory effort, no problems with respiration noted  Abdomen: Soft, gravid, appropriate for gestational age.  Pain/Pressure: Absent     Pelvic: Cervical exam deferred        Extremities: Normal range of motion.  Edema: Trace  Mental Status: Normal mood and affect. Normal behavior. Normal judgment and thought content.   Assessment and Plan:  Pregnancy: G2P0010 at [redacted]w[redacted]d 1. [redacted] weeks gestation of pregnancy  2. Supervision of other normal pregnancy, antepartum FHT and FH normal  3. Vaginal discharge during pregnancy in third trimester Self swab today.  Preterm labor symptoms and general obstetric precautions including but not limited to vaginal bleeding, contractions, leaking of fluid and fetal movement were reviewed in detail with the patient. Please refer to After Visit Summary for other counseling recommendations.   No follow-ups on  file.  Future Appointments  Date Time Provider Department Center  01/28/2021 10:00 AM Levie Heritage, DO [facility] None    Levie Heritage, DO

## 2021-01-15 LAB — CBC
Hematocrit: 28.5 % — ABNORMAL LOW (ref 34.0–46.6)
Hemoglobin: 9.4 g/dL — ABNORMAL LOW (ref 11.1–15.9)
MCH: 29.1 pg (ref 26.6–33.0)
MCHC: 33 g/dL (ref 31.5–35.7)
MCV: 88 fL (ref 79–97)
Platelets: 229 10*3/uL (ref 150–450)
RBC: 3.23 x10E6/uL — ABNORMAL LOW (ref 3.77–5.28)
RDW: 13.1 % (ref 11.7–15.4)
WBC: 5.8 10*3/uL (ref 3.4–10.8)

## 2021-01-15 LAB — GLUCOSE TOLERANCE, 2 HOURS W/ 1HR
Glucose, 1 hour: 141 mg/dL (ref 65–179)
Glucose, 2 hour: 122 mg/dL (ref 65–152)
Glucose, Fasting: 93 mg/dL — ABNORMAL HIGH (ref 65–91)

## 2021-01-15 LAB — HIV ANTIBODY (ROUTINE TESTING W REFLEX): HIV Screen 4th Generation wRfx: NONREACTIVE

## 2021-01-15 LAB — RPR: RPR Ser Ql: NONREACTIVE

## 2021-01-19 ENCOUNTER — Other Ambulatory Visit: Payer: Self-pay

## 2021-01-19 MED ORDER — TERCONAZOLE 0.4 % VA CREA: 1.0000 | TOPICAL_CREAM | Freq: Every day | VAGINAL | 0 refills | Status: AC

## 2021-01-19 NOTE — Telephone Encounter (Signed)
Patient given results of 28 week labs and vaginal culture.  Patient given treatment per protocol for yeast infection.  Will route to provider to review gtt. Patient states that her and Dr. Adrian Blackwater discussed that she had pancakes about eight hours before her lab draw. Patient had only fasting number elevated. Will route to provider to review. Armandina Stammer, RN  01/19/2021 3:59 PM

## 2021-01-20 MED ORDER — ACCU-CHEK SOFTCLIX LANCETS MISC: 1.0000 | Freq: Four times a day (QID) | 12 refills | Status: DC

## 2021-01-20 MED ORDER — ACCU-CHEK NANO SMARTVIEW W/DEVICE KIT
1.0000 | PACK | 0 refills | Status: DC
Start: 2021-01-20 — End: 2021-03-25

## 2021-01-20 MED ORDER — ACCU-CHEK SMARTVIEW VI STRP: ORAL_STRIP | 12 refills | Status: DC

## 2021-01-20 NOTE — Addendum Note (Signed)
Addended by: Levie Heritage on: 01/20/2021 03:26 PM   Modules accepted: Orders, SmartSet

## 2021-01-22 ENCOUNTER — Telehealth: Payer: Self-pay

## 2021-01-22 NOTE — Telephone Encounter (Signed)
-----   Message from Levie Heritage, DO sent at 01/20/2021  3:21 PM EDT ----- Fasting elevated - will need to check CBGs at least for 2 weeks. Testing supplies sent to pharmacy. Additionally, is quite anemic and orders placed for IV iron (venofer weekly for 3 doses). Please notify patient.

## 2021-01-22 NOTE — Telephone Encounter (Signed)
Called pt to discuss GTT and CBC results. Pt made aware that her fasting glucose was elevated and she will need to check CBG f or at least 2 weeks.Pt also made aware that she is anemic and IV iron will be scheduled for her. Understanding was voiced. Latana Colin l Pegah Segel, CMA

## 2021-01-25 ENCOUNTER — Other Ambulatory Visit: Payer: Self-pay | Admitting: Family Medicine

## 2021-01-28 ENCOUNTER — Ambulatory Visit (INDEPENDENT_AMBULATORY_CARE_PROVIDER_SITE_OTHER): Payer: Medicaid Other | Admitting: Family Medicine

## 2021-01-28 ENCOUNTER — Other Ambulatory Visit: Payer: Self-pay

## 2021-01-28 VITALS — BP 112/72 | HR 93 | Wt 173.0 lb

## 2021-01-28 DIAGNOSIS — Z3A30 30 weeks gestation of pregnancy: Secondary | ICD-10-CM

## 2021-01-28 DIAGNOSIS — J4520 Mild intermittent asthma, uncomplicated: Secondary | ICD-10-CM

## 2021-01-28 DIAGNOSIS — D508 Other iron deficiency anemias: Secondary | ICD-10-CM

## 2021-01-28 DIAGNOSIS — R7301 Impaired fasting glucose: Secondary | ICD-10-CM

## 2021-01-28 DIAGNOSIS — Z3480 Encounter for supervision of other normal pregnancy, unspecified trimester: Secondary | ICD-10-CM

## 2021-01-28 MED ORDER — ALBUTEROL SULFATE HFA 108 (90 BASE) MCG/ACT IN AERS: 2.0000 | INHALATION_SPRAY | Freq: Four times a day (QID) | RESPIRATORY_TRACT | 2 refills | Status: DC | PRN

## 2021-01-28 NOTE — Progress Notes (Signed)
   PRENATAL VISIT NOTE  Subjective:  Diamond Forbes is a 25 y.o. G2P0010 at [redacted]w[redacted]d being seen today for ongoing prenatal care.  She is currently monitored for the following issues for this low-risk pregnancy and has Supervision of other normal pregnancy, antepartum; Marijuana use; Mild asthma; and Scoliosis on their problem list.  Patient reports fatigue - takes a couple of naps during the day and still tired. Having some SOB/Chest tightness. Doesn't have albuterol inhaler.  Contractions: Not present. Vag. Bleeding: None.  Movement: Present. Denies leaking of fluid.   The following portions of the patient's history were reviewed and updated as appropriate: allergies, current medications, past family history, past medical history, past social history, past surgical history and problem list.   Objective:   Vitals:   01/28/21 1007  BP: 112/72  Pulse: 93  Weight: 173 lb (78.5 kg)    Fetal Status:   Fundal Height: 30 cm Movement: Present     General:  Alert, oriented and cooperative. Patient is in no acute distress.  Skin: Skin is warm and dry. No rash noted.   Cardiovascular: Normal heart rate noted  Respiratory: Normal respiratory effort, no problems with respiration noted  Abdomen: Soft, gravid, appropriate for gestational age.  Pain/Pressure: Present     Pelvic: Cervical exam deferred        Extremities: Normal range of motion.  Edema: Trace  Mental Status: Normal mood and affect. Normal behavior. Normal judgment and thought content.   Assessment and Plan:  Pregnancy: G2P0010 at [redacted]w[redacted]d 1. [redacted] weeks gestation of pregnancy  2. Supervision of other normal pregnancy, antepartum FHT and FH normal  3. Mild intermittent asthma without complication Start albuterol prn  4. Elevated fasting blood sugar Patient states that she had pancakes about 8 hours prior to 2hr GTT. Discussed that this wouldn't change the test. Recommended at least checking fastings for the next two weeks.  Fasting CBG today is 86.  5. Iron deficiency anemia secondary to inadequate dietary iron intake Iron infusions arranged.   Preterm labor symptoms and general obstetric precautions including but not limited to vaginal bleeding, contractions, leaking of fluid and fetal movement were reviewed in detail with the patient. Please refer to After Visit Summary for other counseling recommendations.   Return in about 2 weeks (around 02/11/2021) for OB f/u.  Future Appointments  Date Time Provider Department Center  02/02/2021  8:00 AM [facility] None  02/11/2021 10:15 AM Levie Heritage, DO [facility] None  02/25/2021 10:00 AM Levie Heritage, DO [facility] None  03/11/2021 10:00 AM Levie Heritage, DO [facility] None  03/18/2021 10:00 AM Levie Heritage, DO [facility] None  03/24/2021 10:00 AM Levie Heritage, DO [facility] None  04/01/2021 10:30 AM Willodean Rosenthal, MD [facility] None    Levie Heritage, DO

## 2021-01-28 NOTE — Progress Notes (Signed)
Patient complaining of fatigue. Armandina Stammer RN   Patient states she would like to not check blood sugars. She had pancakes at midnight the night before the blood sugar test and her fasting blood sugar was elevated by two points. Armandina Stammer RN

## 2021-02-02 ENCOUNTER — Other Ambulatory Visit: Payer: Self-pay

## 2021-02-02 ENCOUNTER — Encounter (HOSPITAL_COMMUNITY)
Admission: RE | Admit: 2021-02-02 | Discharge: 2021-02-02 | Disposition: A | Discharge status: Home / Self Care | Payer: Medicaid Other | Source: Ambulatory Visit | Attending: Family Medicine | Admitting: Family Medicine

## 2021-02-02 DIAGNOSIS — D508 Other iron deficiency anemias: Secondary | ICD-10-CM | POA: Diagnosis present

## 2021-02-02 MED ORDER — SODIUM CHLORIDE 0.9 % IV SOLN: INTRAVENOUS | Status: DC | PRN

## 2021-02-02 MED ORDER — DIPHENHYDRAMINE HCL 50 MG/ML IJ SOLN: 25.0000 mg | Freq: Once | INTRAMUSCULAR | Status: DC | PRN

## 2021-02-02 MED ORDER — METHYLPREDNISOLONE SODIUM SUCC 125 MG IJ SOLR: 125.0000 mg | Freq: Once | INTRAMUSCULAR | Status: DC | PRN

## 2021-02-02 MED ORDER — SODIUM CHLORIDE 0.9 % IV SOLN
300.0000 mg | INTRAVENOUS | Status: DC
  Administered 2021-02-02: 300 mg via INTRAVENOUS
  Filled 2021-02-02: qty 15

## 2021-02-02 MED ORDER — SODIUM CHLORIDE 0.9 % IV BOLUS: 500.0000 mL | Freq: Once | INTRAVENOUS | Status: DC | PRN

## 2021-02-02 MED ORDER — LORATADINE 10 MG PO TABS
ORAL_TABLET | ORAL | Status: AC
  Administered 2021-02-02: 10 mg via ORAL
  Filled 2021-02-02: qty 1

## 2021-02-02 MED ORDER — LORATADINE 10 MG PO TABS: 10.0000 mg | ORAL_TABLET | ORAL | Status: DC

## 2021-02-02 MED ORDER — ACETAMINOPHEN 500 MG PO TABS
1000.0000 mg | ORAL_TABLET | ORAL | Status: DC
  Administered 2021-02-02: 1000 mg via ORAL

## 2021-02-02 MED ORDER — ALBUTEROL SULFATE (2.5 MG/3ML) 0.083% IN NEBU: 2.5000 mg | INHALATION_SOLUTION | Freq: Once | RESPIRATORY_TRACT | Status: DC | PRN

## 2021-02-02 MED ORDER — EPINEPHRINE PF 1 MG/ML IJ SOLN: 0.3000 mg | Freq: Once | INTRAMUSCULAR | Status: DC | PRN

## 2021-02-02 MED ORDER — ACETAMINOPHEN 500 MG PO TABS
ORAL_TABLET | ORAL | Status: AC
  Filled 2021-02-02: qty 2

## 2021-02-02 NOTE — Discharge Instructions (Signed)

## 2021-02-09 ENCOUNTER — Encounter (HOSPITAL_COMMUNITY): Payer: Medicaid Other

## 2021-02-11 ENCOUNTER — Other Ambulatory Visit: Payer: Self-pay

## 2021-02-11 ENCOUNTER — Ambulatory Visit (INDEPENDENT_AMBULATORY_CARE_PROVIDER_SITE_OTHER): Payer: Medicaid Other | Admitting: Family Medicine

## 2021-02-11 ENCOUNTER — Encounter: Payer: Self-pay | Admitting: General Practice

## 2021-02-11 VITALS — BP 119/68 | HR 91 | Wt 180.0 lb

## 2021-02-11 DIAGNOSIS — Z3480 Encounter for supervision of other normal pregnancy, unspecified trimester: Secondary | ICD-10-CM

## 2021-02-11 DIAGNOSIS — Z3A32 32 weeks gestation of pregnancy: Secondary | ICD-10-CM

## 2021-02-11 DIAGNOSIS — O24410 Gestational diabetes mellitus in pregnancy, diet controlled: Secondary | ICD-10-CM

## 2021-02-11 NOTE — Progress Notes (Signed)
Patient states she checked her blood sugars last week and then this morning and has pictures of the results on her phone. Patient asked to record them on the blood sugar log we gave her so we can scan them into her chart. Patient states understanding. Armandina Stammer RN

## 2021-02-11 NOTE — Progress Notes (Signed)
   PRENATAL VISIT NOTE  Subjective:  Diamond Forbes is a 25 y.o. G2P0010 at 107w3d being seen today for ongoing prenatal care.  She is currently monitored for the following issues for this high-risk pregnancy and has Supervision of other normal pregnancy, antepartum; Marijuana use; Mild asthma; and Scoliosis on their problem list.  Patient reports no complaints.  Contractions: Not present. Vag. Bleeding: None.  Movement: Present. Denies leaking of fluid.   The following portions of the patient's history were reviewed and updated as appropriate: allergies, current medications, past family history, past medical history, past social history, past surgical history and problem list.   Objective:   Vitals:   02/11/21 1019  BP: 119/68  Pulse: 91  Weight: 180 lb (81.6 kg)    Fetal Status: Fetal Heart Rate (bpm): 155 Fundal Height: 33 cm Movement: Present  Presentation: Vertex  General:  Alert, oriented and cooperative. Patient is in no acute distress.  Skin: Skin is warm and dry. No rash noted.   Cardiovascular: Normal heart rate noted  Respiratory: Normal respiratory effort, no problems with respiration noted  Abdomen: Soft, gravid, appropriate for gestational age.  Pain/Pressure: Present     Pelvic: Cervical exam deferred        Extremities: Normal range of motion.  Edema: Trace  Mental Status: Normal mood and affect. Normal behavior. Normal judgment and thought content.   Assessment and Plan:  Pregnancy: G2P0010 at [redacted]w[redacted]d 1. Supervision of other normal pregnancy, antepartum FHT and FH normal - [place] MFM OB FOLLOW UP; Future  2. [redacted] weeks gestation of pregnancy  3. Diet controlled gestational diabetes mellitus (GDM) in third trimester Has checked 4 fastings, half slightly elevated. Will have her check after meals as well. F/u US at 36 weeks. - [place] MFM OB FOLLOW UP; Future  Preterm labor symptoms and general obstetric precautions including but not limited to vaginal bleeding,  contractions, leaking of fluid and fetal movement were reviewed in detail with the patient. Please refer to After Visit Summary for other counseling recommendations.   No follow-ups on file.  Future Appointments  Date Time Provider Department Center  02/12/2021  1:00 PM [facility] None  02/17/2021 12:00 PM [facility] None  02/25/2021 10:00 AM Levie Heritage, DO [facility] None  03/11/2021 10:00 AM Levie Heritage, DO [facility] None  03/18/2021 10:00 AM Levie Heritage, DO [facility] None  03/24/2021 10:00 AM Levie Heritage, DO [facility] None  04/01/2021 10:30 AM Willodean Rosenthal, MD [facility] None    Levie Heritage, DO

## 2021-02-12 ENCOUNTER — Encounter (HOSPITAL_COMMUNITY)
Admission: RE | Admit: 2021-02-12 | Discharge: 2021-02-12 | Disposition: A | Discharge status: Home / Self Care | Payer: Medicaid Other | Source: Ambulatory Visit | Attending: Family Medicine | Admitting: Family Medicine

## 2021-02-12 DIAGNOSIS — D508 Other iron deficiency anemias: Secondary | ICD-10-CM

## 2021-02-12 MED ORDER — SODIUM CHLORIDE 0.9 % IV BOLUS: 500.0000 mL | Freq: Once | INTRAVENOUS | Status: DC | PRN

## 2021-02-12 MED ORDER — EPINEPHRINE PF 1 MG/ML IJ SOLN: 0.3000 mg | Freq: Once | INTRAMUSCULAR | Status: DC | PRN

## 2021-02-12 MED ORDER — SODIUM CHLORIDE 0.9 % IV SOLN
300.0000 mg | INTRAVENOUS | Status: DC
  Administered 2021-02-12: 300 mg via INTRAVENOUS
  Filled 2021-02-12: qty 15

## 2021-02-12 MED ORDER — LORATADINE 10 MG PO TABS
ORAL_TABLET | ORAL | Status: AC
  Filled 2021-02-12: qty 1

## 2021-02-12 MED ORDER — ACETAMINOPHEN 500 MG PO TABS
1000.0000 mg | ORAL_TABLET | ORAL | Status: DC
  Administered 2021-02-12: 1000 mg via ORAL

## 2021-02-12 MED ORDER — DIPHENHYDRAMINE HCL 50 MG/ML IJ SOLN: 25.0000 mg | Freq: Once | INTRAMUSCULAR | Status: DC | PRN

## 2021-02-12 MED ORDER — LORATADINE 10 MG PO TABS
10.0000 mg | ORAL_TABLET | ORAL | Status: DC
  Administered 2021-02-12: 10 mg via ORAL

## 2021-02-12 MED ORDER — METHYLPREDNISOLONE SODIUM SUCC 125 MG IJ SOLR: 125.0000 mg | Freq: Once | INTRAMUSCULAR | Status: DC | PRN

## 2021-02-12 MED ORDER — ACETAMINOPHEN 500 MG PO TABS
ORAL_TABLET | ORAL | Status: AC
  Filled 2021-02-12: qty 2

## 2021-02-12 MED ORDER — ALBUTEROL SULFATE (2.5 MG/3ML) 0.083% IN NEBU: 2.5000 mg | INHALATION_SOLUTION | Freq: Once | RESPIRATORY_TRACT | Status: DC | PRN

## 2021-02-12 MED ORDER — SODIUM CHLORIDE 0.9 % IV SOLN: INTRAVENOUS | Status: DC | PRN

## 2021-02-17 ENCOUNTER — Inpatient Hospital Stay (HOSPITAL_COMMUNITY)
Admission: AD | Admit: 2021-02-17 | Discharge: 2021-02-17 | Disposition: A | Discharge status: Home / Self Care | Payer: Medicaid Other | Attending: Obstetrics & Gynecology | Admitting: Obstetrics & Gynecology

## 2021-02-17 ENCOUNTER — Encounter (HOSPITAL_COMMUNITY): Payer: Self-pay | Admitting: Obstetrics & Gynecology

## 2021-02-17 ENCOUNTER — Other Ambulatory Visit: Payer: Self-pay

## 2021-02-17 ENCOUNTER — Inpatient Hospital Stay (HOSPITAL_COMMUNITY)
Admission: RE | Admit: 2021-02-17 | Discharge: 2021-02-17 | Disposition: A | Discharge status: Home / Self Care | Payer: Medicaid Other | Source: Ambulatory Visit | Attending: Family Medicine | Admitting: Family Medicine

## 2021-02-17 ENCOUNTER — Telehealth: Payer: Self-pay

## 2021-02-17 ENCOUNTER — Inpatient Hospital Stay (HOSPITAL_COMMUNITY): Admission: AD | Admit: 2021-02-17 | Payer: Self-pay | Admitting: Obstetrics & Gynecology

## 2021-02-17 DIAGNOSIS — O99891 Other specified diseases and conditions complicating pregnancy: Secondary | ICD-10-CM | POA: Insufficient documentation

## 2021-02-17 DIAGNOSIS — Z3A33 33 weeks gestation of pregnancy: Secondary | ICD-10-CM | POA: Insufficient documentation

## 2021-02-17 DIAGNOSIS — O26893 Other specified pregnancy related conditions, third trimester: Secondary | ICD-10-CM

## 2021-02-17 DIAGNOSIS — Z3480 Encounter for supervision of other normal pregnancy, unspecified trimester: Secondary | ICD-10-CM

## 2021-02-17 DIAGNOSIS — R102 Pelvic and perineal pain: Secondary | ICD-10-CM

## 2021-02-17 DIAGNOSIS — R1031 Right lower quadrant pain: Secondary | ICD-10-CM | POA: Diagnosis not present

## 2021-02-17 DIAGNOSIS — N949 Unspecified condition associated with female genital organs and menstrual cycle: Secondary | ICD-10-CM

## 2021-02-17 HISTORY — DX: Gestational diabetes mellitus in pregnancy, unspecified control: O24.419

## 2021-02-17 LAB — URINALYSIS, ROUTINE W REFLEX MICROSCOPIC
Bilirubin Urine: NEGATIVE
Glucose, UA: NEGATIVE mg/dL
Hgb urine dipstick: NEGATIVE
Ketones, ur: NEGATIVE mg/dL
Leukocytes,Ua: NEGATIVE
Nitrite: NEGATIVE
Protein, ur: NEGATIVE mg/dL
Specific Gravity, Urine: 1.012 (ref 1.005–1.030)
pH: 6 (ref 5.0–8.0)

## 2021-02-17 LAB — WET PREP, GENITAL
Clue Cells Wet Prep HPF POC: NONE SEEN
Sperm: NONE SEEN
Trich, Wet Prep: NONE SEEN
Yeast Wet Prep HPF POC: NONE SEEN

## 2021-02-17 MED ORDER — ACETAMINOPHEN 500 MG PO TABS
1000.0000 mg | ORAL_TABLET | Freq: Once | ORAL | Status: AC
  Administered 2021-02-17: 1000 mg via ORAL
  Filled 2021-02-17: qty 2

## 2021-02-17 MED ORDER — CYCLOBENZAPRINE HCL 10 MG PO TABS: 10.0000 mg | ORAL_TABLET | Freq: Two times a day (BID) | ORAL | 0 refills | Status: DC | PRN

## 2021-02-17 NOTE — Discharge Instructions (Signed)

## 2021-02-17 NOTE — Telephone Encounter (Signed)
Pt called stating she woke up feeling more pressure than usual and it is making it hard for her to walk. Pt states baby is moving and denies leakage of fluid. Advised pt to go to [facility] at [facility] to be seen. Understanding was voiced. Kavan Devan l Ayyan Sites, CMA

## 2021-02-17 NOTE — MAU Provider Note (Addendum)
Chief Complaint:  Pelvic Pain and Pelvic Pressure  Event Date/Time  First Provider Initiated Contact with Patient 02/17/21 1206    HPI: Diamond Forbes is a 25 y.o. G2P0010 at [redacted]w[redacted]d by LMP who presents to maternity admissions reporting intense pelvic pain and pelvic pressure. She reports good fetal movement, denies LOF, vaginal bleeding, vaginal itching/burning, urinary symptoms, h/a, dizziness, n/v, or fever/chills.    Pelvic and lower back pain Peg started experiencing right lower quadrant pain this morning after awaking. Her abdominal pain is described as radiating pain that is constant, elicited by movement and rated a 7/10. Her pain felt worse after voiding when she "felt the baby move down". She states that speed bumps hurt. 30 min before exam her pain seem to improve to a 3/10 while resting in bed.  Additionally she has lower back pain. Her lower back pain is described as aching and soreness rated at a 7/10. Only other symptom experienced in the last 24 hrs has been diarrhea. She has tried rest with no improvement. Moving is an aggravating factor. Pertinent negative: no fever, n/v, flank pain, hematuria.   Past Medical History: Past Medical History:  Diagnosis Date   Asthma    Gestational diabetes     Past obstetric history: OB History  Gravida Para Term Preterm AB Living  2       1 0  SAB IAB Ectopic Multiple Live Births  1       0    # Outcome Date GA Lbr Len/2nd Weight Sex Delivery Anes PTL Lv  2 Current           1 SAB             Past Surgical History: Past Surgical History:  Procedure Laterality Date   NO PAST SURGERIES      Family History: Family History  Problem Relation Age of Onset   Healthy Mother    Healthy Father     Social History: Social History   Tobacco Use   Smoking status: Never Smoker   Smokeless tobacco: Never Used  Scientific laboratory technician Use: Some days   Substances: THC  Substance Use Topics   Alcohol use: Not Currently     Comment: Socially   Drug use: Yes    Types: Marijuana    Comment: last smoked 09/05/20    Allergies:  Allergies  Allergen Reactions   Cephalexin Diarrhea and Rash    "severe diarrhea"    Meds:  Medications Prior to Admission  Medication Sig Dispense Refill Last Dose   Prenatal Vit-Fe Fumarate-FA (PRENATAL VITAMINS PO) Take by mouth.   02/16/2021 at Unknown time   Accu-Chek Softclix Lancets lancets 1 each by Other route 4 (four) times daily. 100 each 12    albuterol (VENTOLIN HFA) 108 (90 Base) MCG/ACT inhaler Inhale 2 puffs into the lungs every 6 (six) hours as needed for wheezing or shortness of breath. 8 g 2 Unknown at Unknown time   Blood Glucose Monitoring Suppl (ACCU-CHEK NANO SMARTVIEW) w/Device KIT 1 kit by Subdermal route as directed. Check blood sugars for fasting, and two hours after breakfast, lunch and dinner (4 checks daily) 1 kit 0    cyclobenzaprine (FLEXERIL) 5 MG tablet Take by mouth. (Patient not taking: Reported on 02/11/2021)      glucose blood (ACCU-CHEK SMARTVIEW) test strip Use as instructed to check blood sugars 100 each 12    ondansetron (ZOFRAN ODT) 4 MG disintegrating tablet Take 1 tablet (4 mg total)  by mouth every 8 (eight) hours as needed for nausea or vomiting. 60 tablet 2 Unknown at Unknown time   valACYclovir (VALTREX) 500 MG tablet Take 500 mg by mouth daily.       ROS:  Review of Systems  Constitutional: Negative for chills and fever.  HENT: Negative for congestion.   Cardiovascular: Negative for chest pain.  Gastrointestinal: Positive for abdominal pain and diarrhea. Negative for abdominal distention, blood in stool, constipation, nausea and vomiting.  Genitourinary: Negative for difficulty urinating, dysuria, flank pain, hematuria, vaginal bleeding and vaginal discharge.  Musculoskeletal: Positive for back pain.  Neurological: Negative for headaches.  All other systems reviewed and negative.    I have reviewed patient's Past Medical Hx, Surgical  Hx, Family Hx, Social Hx, medications and allergies.   Physical Exam   Patient Vitals for the past 24 hrs:  BP Temp Temp src Pulse Resp SpO2 Height Weight  02/17/21 1215 -- -- -- -- -- 99 % -- --  02/17/21 1210 -- -- -- -- -- 99 % -- --  02/17/21 1205 -- -- -- -- -- 99 % -- --  02/17/21 1200 -- -- -- -- -- 96 % -- --  02/17/21 1155 -- -- -- -- -- 97 % -- --  02/17/21 1150 -- -- -- -- -- 99 % -- --  02/17/21 1148 117/69 -- -- 92 -- -- -- --  02/17/21 1145 -- -- -- -- -- 99 % -- --  02/17/21 1140 -- -- -- -- -- 98 % -- --  02/17/21 1120 127/70 98.2 F (36.8 C) Oral (!) 103 18 99 % -- --  02/17/21 1115 -- -- -- -- -- -- $Rem'5\' 5"'MyoV$  (1.651 m) 83.2 kg   Constitutional: Well-developed, well-nourished female in notable pain during movements.  Cardiovascular: normal rate Respiratory: normal effort GI: Tender, guarding +, and rebound +. Abd soft, gravid appropriate for gestational age.  MS: Extremities nontender, no edema, normal ROM Neurologic: Alert and oriented x 4.  GU: Neg CVAT. Dilation: Closed Effacement (%): Thick Cervical Position: Posterior Station: Ballotable Presentation: Undeterminable Exam by:: Maryagnes Amos, CNM  FHT:  Baseline 150, moderate variability, accelerations present, no decelerations Contractions: q 2-3 mins   Labs: Results for orders placed or performed during the hospital encounter of 02/17/21 (from the past 24 hour(s))  Urinalysis, Routine w reflex microscopic Urine, Clean Catch     Status: None   Collection Time: 02/17/21 12:14 PM  Result Value Ref Range   Color, Urine YELLOW YELLOW   APPearance CLEAR CLEAR   Specific Gravity, Urine 1.012 1.005 - 1.030   pH 6.0 5.0 - 8.0   Glucose, UA NEGATIVE NEGATIVE mg/dL   Hgb urine dipstick NEGATIVE NEGATIVE   Bilirubin Urine NEGATIVE NEGATIVE   Ketones, ur NEGATIVE NEGATIVE mg/dL   Protein, ur NEGATIVE NEGATIVE mg/dL   Nitrite NEGATIVE NEGATIVE   Leukocytes,Ua NEGATIVE NEGATIVE  Wet prep, genital     Status:  Abnormal   Collection Time: 02/17/21  1:21 PM  Result Value Ref Range   Yeast Wet Prep HPF POC NONE SEEN NONE SEEN   Trich, Wet Prep NONE SEEN NONE SEEN   Clue Cells Wet Prep HPF POC NONE SEEN NONE SEEN   WBC, Wet Prep HPF POC MODERATE (A) NONE SEEN   Sperm NONE SEEN    O/Positive/-- (12/14 1059)  Imaging:  No results found.  MAU Course/MDM: Orders Placed This Encounter  Procedures   Wet prep, genital   Urinalysis, Routine w reflex microscopic  Urine, Clean Catch    Meds ordered this encounter  Medications   acetaminophen (TYLENOL) tablet 1,000 mg     NST reviewed and classified as a category 1 strip.  Treatments in MAU included administration of tylenol and PO fluids.   Assessment: 1. Supervision of other normal pregnancy, antepartum   2. Abdominal and back pain  Scotlyn Forbes presented to the MAU unit due to intense abdominal/pelvic pain. Wet prep and U/A wnl make infection an unlikely etiology. Because her pain is localized, elicited by movement and she doesn't have fever, nausea, vomiting the most likely etiology is musculoskeletal in origin. The clinical diagnosis is tenderness and pain due to the stretching of the round ligament and normal symptoms of pregnancy. Pain improved with tylenol, fluids, and rest.    Plan: 1. Supervision of other normal pregnancy, antepartum  - continue antepartum care  2. Abdominal and back pain  - Prescribe tylenol 500 mg q6 prn  - Prescribe muscle relaxant - flexeril prn  3. Discharge home with labor precautions and fetal kick counts.   - Advised to return if pain changes in quality, experiences fever, leakage of fluids, or vaginal bleeding.      Sharol Harness Butterfield, Medical Student 02/17/2021 2:15 PM  CNM attestation:  I have seen and examined this patient and agree with above documentation in the medical student's note.   Diamond Forbes is a 25 y.o. G2P0010 at [redacted]w[redacted]d reporting pelvic pain and lower back  pain. Pain is worse with movement, walking, driving over speed bumps. She did not take anything for pain relief, but reports pain has slightly improved since she has been in MAU. She denies vaginal bleeding, LOF. She does endorse some external vaginal itching and vaginal discharge. Treated for yeast infection last month.   PE: Patient Vitals for the past 24 hrs:  BP Temp Temp src Pulse Resp SpO2 Height Weight  02/17/21 1500 119/61 -- -- 92 -- 100 % -- --  02/17/21 1215 -- -- -- -- -- 99 % -- --  02/17/21 1210 -- -- -- -- -- 99 % -- --  02/17/21 1205 -- -- -- -- -- 99 % -- --  02/17/21 1200 -- -- -- -- -- 96 % -- --  02/17/21 1155 -- -- -- -- -- 97 % -- --  02/17/21 1150 -- -- -- -- -- 99 % -- --  02/17/21 1148 117/69 -- -- 92 -- -- -- --  02/17/21 1145 -- -- -- -- -- 99 % -- --  02/17/21 1140 -- -- -- -- -- 98 % -- --  02/17/21 1120 127/70 98.2 F (36.8 C) Oral (!) 103 18 99 % -- --  02/17/21 1115 -- -- -- -- -- -- $Rem'5\' 5"'lMqL$  (1.651 m) 183 lb 8 oz (83.2 kg)   Gen: calm comfortable, NAD Resp: normal effort, no distress Heart: Regular rate Abd: Soft, mild tenderness along right side of abdomen, gravid, S=D  FHR: Baseline 145, moderate variability, pos accels, no decels Toco: UC's irregular with UI, improved after PO hydration  ROS, labs, PMH reviewed  Orders Placed This Encounter  Procedures   Wet prep, genital   Urinalysis, Routine w reflex microscopic Urine, Clean Catch   Discharge patient   Meds ordered this encounter  Medications   acetaminophen (TYLENOL) tablet 1,000 mg   cyclobenzaprine (FLEXERIL) 10 MG tablet    Sig: Take 1 tablet (10 mg total) by mouth 2 (two) times daily as needed for muscle spasms.  Dispense:  20 tablet    Refill:  0    Order Specific Question:   Supervising Provider    Answer:   Donnamae Jude [[id number]]    MDM UA unremarkable Wet prep negative PO hydration with improvement in UC and UI Pain improvement with Tylenol PO  Pain likely  musculoskeletal vs round ligament pain Flexeril Rx sent to pharmacy  Assessment: 1. Round ligament pain   2. Supervision of other normal pregnancy, antepartum   3. [redacted] weeks gestation of pregnancy     Plan: - Discharge home in stable condition. - Preterm labor precautions reviewed and fetal kick counts. - Follow-up as scheduled at your doctor's office for next prenatal visit or sooner as needed if symptoms worsen. - Return to maternity admissions symptoms worsen  Maryagnes Amos, [facility] 02/17/2021 3:08 PM

## 2021-02-17 NOTE — MAU Note (Signed)
Presents with c/o pelvic pain and pressure since 0800 this morning.  States pressure and pain remain despite resting for 1 hour.  Denies VB or LOF.  Endorses +FM.

## 2021-02-25 ENCOUNTER — Ambulatory Visit (INDEPENDENT_AMBULATORY_CARE_PROVIDER_SITE_OTHER): Payer: Medicaid Other | Admitting: Family Medicine

## 2021-02-25 ENCOUNTER — Other Ambulatory Visit: Payer: Self-pay

## 2021-02-25 VITALS — BP 119/78 | HR 80 | Wt 183.0 lb

## 2021-02-25 DIAGNOSIS — N949 Unspecified condition associated with female genital organs and menstrual cycle: Secondary | ICD-10-CM

## 2021-02-25 DIAGNOSIS — D508 Other iron deficiency anemias: Secondary | ICD-10-CM

## 2021-02-25 DIAGNOSIS — O24410 Gestational diabetes mellitus in pregnancy, diet controlled: Secondary | ICD-10-CM | POA: Insufficient documentation

## 2021-02-25 DIAGNOSIS — Z3480 Encounter for supervision of other normal pregnancy, unspecified trimester: Secondary | ICD-10-CM

## 2021-02-25 NOTE — Progress Notes (Signed)
   PRENATAL VISIT NOTE  Subjective:  Diamond Forbes is a 25 y.o. G2P0010 at [redacted]w[redacted]d being seen today for ongoing prenatal care.  She is currently monitored for the following issues for this high-risk pregnancy and has Supervision of other normal pregnancy, antepartum; Marijuana use; Mild asthma; Scoliosis; and Diet controlled gestational diabetes mellitus (GDM) in third trimester on their problem list.  Patient reports some pain on sides of uterus with movement.  Contractions: Not present.  .  Movement: Present. Denies leaking of fluid.   The following portions of the patient's history were reviewed and updated as appropriate: allergies, current medications, past family history, past medical history, past social history, past surgical history and problem list.   Objective:   Vitals:   02/25/21 1011  BP: 119/78  Pulse: 80  Weight: 183 lb (83 kg)    Fetal Status: Fetal Heart Rate (bpm): 144   Movement: Present     General:  Alert, oriented and cooperative. Patient is in no acute distress.  Skin: Skin is warm and dry. No rash noted.   Cardiovascular: Normal heart rate noted  Respiratory: Normal respiratory effort, no problems with respiration noted  Abdomen: Soft, gravid, appropriate for gestational age.  Pain/Pressure: Absent   Round ligament tenderness  Pelvic: Cervical exam deferred        Extremities: Normal range of motion.     Mental Status: Normal mood and affect. Normal behavior. Normal judgment and thought content.   Assessment and Plan:  Pregnancy: G2P0010 at [redacted]w[redacted]d 1. Supervision of other normal pregnancy, antepartum FHT and FH normal  2. Diet controlled gestational diabetes mellitus (GDM) in third trimester Checked a few times last week - has wide range of values. Checking between an hour and 1.5 hr post prandial.  2-3 fastings elevated, but not checking consistently. Discussed checking regularly.  [place] at 36 weeks  3. Round ligament pain  4. Iron deficiency anemia  secondary to inadequate dietary iron intake S/p iron  Preterm labor symptoms and general obstetric precautions including but not limited to vaginal bleeding, contractions, leaking of fluid and fetal movement were reviewed in detail with the patient. Please refer to After Visit Summary for other counseling recommendations.   No follow-ups on file.  Future Appointments  Date Time Provider Department Center  03/05/2021 11:00 AM [facility] None  03/11/2021  8:00 AM [facility] NURSE [facility]  03/11/2021  8:15 AM [facility] US2 [facility]  03/11/2021 10:00 AM Levie Heritage, DO [facility] None  03/18/2021 10:00 AM Levie Heritage, DO [facility] None  03/24/2021 10:00 AM Levie Heritage, DO [facility] None  04/01/2021 10:30 AM Willodean Rosenthal, MD [facility] None    Levie Heritage, DO

## 2021-03-05 ENCOUNTER — Other Ambulatory Visit: Payer: Self-pay

## 2021-03-05 ENCOUNTER — Ambulatory Visit (HOSPITAL_COMMUNITY)
Admission: RE | Admit: 2021-03-05 | Discharge: 2021-03-05 | Disposition: A | Discharge status: Home / Self Care | Payer: Medicaid Other | Source: Ambulatory Visit | Attending: Family Medicine | Admitting: Family Medicine

## 2021-03-05 DIAGNOSIS — D508 Other iron deficiency anemias: Secondary | ICD-10-CM | POA: Diagnosis present

## 2021-03-05 MED ORDER — ACETAMINOPHEN 500 MG PO TABS
ORAL_TABLET | ORAL | Status: AC
  Filled 2021-03-05: qty 2

## 2021-03-05 MED ORDER — DIPHENHYDRAMINE HCL 50 MG/ML IJ SOLN: 25.0000 mg | Freq: Once | INTRAMUSCULAR | Status: DC | PRN

## 2021-03-05 MED ORDER — EPINEPHRINE PF 1 MG/ML IJ SOLN: 0.3000 mg | Freq: Once | INTRAMUSCULAR | Status: DC | PRN

## 2021-03-05 MED ORDER — SODIUM CHLORIDE 0.9 % IV SOLN
300.0000 mg | INTRAVENOUS | Status: DC
  Administered 2021-03-05: 300 mg via INTRAVENOUS
  Filled 2021-03-05: qty 15

## 2021-03-05 MED ORDER — LORATADINE 10 MG PO TABS
ORAL_TABLET | ORAL | Status: AC
  Filled 2021-03-05: qty 1

## 2021-03-05 MED ORDER — METHYLPREDNISOLONE SODIUM SUCC 125 MG IJ SOLR: 125.0000 mg | Freq: Once | INTRAMUSCULAR | Status: DC | PRN

## 2021-03-05 MED ORDER — ACETAMINOPHEN 500 MG PO TABS
1000.0000 mg | ORAL_TABLET | ORAL | Status: DC
  Administered 2021-03-05: 1000 mg via ORAL

## 2021-03-05 MED ORDER — LORATADINE 10 MG PO TABS
10.0000 mg | ORAL_TABLET | ORAL | Status: DC
  Administered 2021-03-05: 10 mg via ORAL

## 2021-03-05 MED ORDER — ALBUTEROL SULFATE (2.5 MG/3ML) 0.083% IN NEBU: 2.5000 mg | INHALATION_SOLUTION | Freq: Once | RESPIRATORY_TRACT | Status: DC | PRN

## 2021-03-05 MED ORDER — SODIUM CHLORIDE 0.9 % IV SOLN: INTRAVENOUS | Status: DC | PRN

## 2021-03-05 MED ORDER — SODIUM CHLORIDE 0.9 % IV BOLUS: 500.0000 mL | Freq: Once | INTRAVENOUS | Status: DC | PRN

## 2021-03-10 NOTE — Progress Notes (Signed)
   Covid-19 Vaccination Clinic  Name:  Diamond Forbes    MRN: [id number] DOB: 1996-02-21  03/10/2021  Diamond Forbes was observed post Covid-19 immunization for 15 minutes without incident. She was provided with Vaccine Information Sheet and instruction to access the V-Safe system.   Diamond Forbes was instructed to call 911 with any severe reactions post vaccine: Marland Kitchen Difficulty breathing  . Swelling of face and throat  . A fast heartbeat  . A bad rash all over body  . Dizziness and weakness   Immunizations Administered    Name Date Dose VIS Date Route   Moderna COVID-19 Vaccine 11/13/2020  2:15 PM 0.5 mL 08/26/2020 Intramuscular   Manufacturer: Gala Murdoch   Lot: [id number]   NDC: 51102-111-73   Pfizer COVID-19 Vaccine 11/13/2020 -- -- --

## 2021-03-11 ENCOUNTER — Other Ambulatory Visit: Payer: Self-pay

## 2021-03-11 ENCOUNTER — Ambulatory Visit: Payer: Medicaid Other | Attending: Family Medicine

## 2021-03-11 ENCOUNTER — Ambulatory Visit (INDEPENDENT_AMBULATORY_CARE_PROVIDER_SITE_OTHER): Payer: Medicaid Other | Admitting: Family Medicine

## 2021-03-11 ENCOUNTER — Encounter: Payer: Self-pay | Admitting: *Deleted

## 2021-03-11 ENCOUNTER — Ambulatory Visit: Payer: Medicaid Other | Admitting: *Deleted

## 2021-03-11 ENCOUNTER — Other Ambulatory Visit (HOSPITAL_COMMUNITY)
Admission: RE | Admit: 2021-03-11 | Discharge: 2021-03-11 | Disposition: A | Discharge status: Home / Self Care | Payer: Medicaid Other | Source: Ambulatory Visit | Attending: Family Medicine | Admitting: Family Medicine

## 2021-03-11 ENCOUNTER — Other Ambulatory Visit: Payer: Self-pay | Admitting: Family Medicine

## 2021-03-11 VITALS — BP 130/77 | HR 72 | Wt 190.0 lb

## 2021-03-11 DIAGNOSIS — D508 Other iron deficiency anemias: Secondary | ICD-10-CM

## 2021-03-11 DIAGNOSIS — Z3A36 36 weeks gestation of pregnancy: Secondary | ICD-10-CM | POA: Insufficient documentation

## 2021-03-11 DIAGNOSIS — O24410 Gestational diabetes mellitus in pregnancy, diet controlled: Secondary | ICD-10-CM | POA: Diagnosis present

## 2021-03-11 DIAGNOSIS — Z3480 Encounter for supervision of other normal pregnancy, unspecified trimester: Secondary | ICD-10-CM

## 2021-03-11 LAB — OB RESULTS CONSOLE GC/CHLAMYDIA: Gonorrhea: NEGATIVE

## 2021-03-11 NOTE — Progress Notes (Signed)
   PRENATAL VISIT NOTE  Subjective:  Diamond Forbes is a 25 y.o. G2P0010 at [redacted]w[redacted]d being seen today for ongoing prenatal care.  She is currently monitored for the following issues for this high-risk pregnancy and has Supervision of other normal pregnancy, antepartum; Marijuana use; Mild asthma; Scoliosis; and Diet controlled gestational diabetes mellitus (GDM) in third trimester on their problem list.  Patient reports some pain on sides of uterus with movement.  Contractions: Not present. Vag. Bleeding: None.  Movement: Present. Denies leaking of fluid.   The following portions of the patient's history were reviewed and updated as appropriate: allergies, current medications, past family history, past medical history, past social history, past surgical history and problem list.   Objective:   Vitals:   03/11/21 1006  BP: 130/77  Pulse: 72  Weight: 190 lb (86.2 kg)    Fetal Status: Fetal Heart Rate (bpm): 145   Movement: Present     General:  Alert, oriented and cooperative. Patient is in no acute distress.  Skin: Skin is warm and dry. No rash noted.   Cardiovascular: Normal heart rate noted  Respiratory: Normal respiratory effort, no problems with respiration noted  Abdomen: Soft, gravid, appropriate for gestational age.  Pain/Pressure: Present   Round ligament tenderness  Pelvic: Cervical exam deferred        Extremities: Normal range of motion.  Edema: None  Mental Status: Normal mood and affect. Normal behavior. Normal judgment and thought content.   Assessment and Plan:  Pregnancy: G2P0010 at [redacted]w[redacted]d 1. Supervision of other normal pregnancy, antepartum FHT and FH normal  2. Diet controlled gestational diabetes mellitus (GDM) in third trimester [place] today EFW 96%ile Remains inconsistent with checking CBGs. Some minimally elevated fastings and minimally elevated postprandial.  Discussed checking regularly.  MFM recommended BPP weekly - will schedule. They also recommended delivery  at 39 weeks, possibly 38 if not well controlled.  3. Round ligament pain  4. Iron deficiency anemia secondary to inadequate dietary iron intake S/p iron  Preterm labor symptoms and general obstetric precautions including but not limited to vaginal bleeding, contractions, leaking of fluid and fetal movement were reviewed in detail with the patient. Please refer to After Visit Summary for other counseling recommendations.   Return in about 1 week (around 03/18/2021).  Future Appointments  Date Time Provider Department Center  03/17/2021 10:15 AM [facility]  03/18/2021 10:00 AM Levie Heritage, DO [facility] None  03/24/2021 10:00 AM Levie Heritage, DO [facility] None  03/24/2021  1:15 PM [facility]  03/31/2021 10:15 AM [facility]  04/01/2021 10:30 AM Willodean Rosenthal, MD [facility] None    Levie Heritage, DO

## 2021-03-12 LAB — GC/CHLAMYDIA PROBE AMP (~~LOC~~) NOT AT ARMC
Chlamydia: NEGATIVE
Comment: NEGATIVE
Comment: NORMAL
Neisseria Gonorrhea: NEGATIVE

## 2021-03-15 LAB — CULTURE, BETA STREP (GROUP B ONLY): Strep Gp B Culture: NEGATIVE

## 2021-03-17 ENCOUNTER — Ambulatory Visit: Payer: Medicaid Other | Admitting: *Deleted

## 2021-03-17 ENCOUNTER — Other Ambulatory Visit: Payer: Self-pay

## 2021-03-17 ENCOUNTER — Ambulatory Visit (INDEPENDENT_AMBULATORY_CARE_PROVIDER_SITE_OTHER): Payer: Medicaid Other

## 2021-03-17 ENCOUNTER — Other Ambulatory Visit: Payer: Medicaid Other

## 2021-03-17 VITALS — BP 131/88 | HR 84 | Wt 189.2 lb

## 2021-03-17 DIAGNOSIS — O3663X0 Maternal care for excessive fetal growth, third trimester, not applicable or unspecified: Secondary | ICD-10-CM | POA: Diagnosis not present

## 2021-03-17 DIAGNOSIS — O24410 Gestational diabetes mellitus in pregnancy, diet controlled: Secondary | ICD-10-CM | POA: Diagnosis not present

## 2021-03-18 ENCOUNTER — Ambulatory Visit (INDEPENDENT_AMBULATORY_CARE_PROVIDER_SITE_OTHER): Payer: Medicaid Other | Admitting: Family Medicine

## 2021-03-18 VITALS — BP 125/85 | HR 80 | Wt 190.0 lb

## 2021-03-18 DIAGNOSIS — Z3480 Encounter for supervision of other normal pregnancy, unspecified trimester: Secondary | ICD-10-CM

## 2021-03-18 DIAGNOSIS — Z3A37 37 weeks gestation of pregnancy: Secondary | ICD-10-CM

## 2021-03-18 DIAGNOSIS — O24410 Gestational diabetes mellitus in pregnancy, diet controlled: Secondary | ICD-10-CM

## 2021-03-18 DIAGNOSIS — J4520 Mild intermittent asthma, uncomplicated: Secondary | ICD-10-CM

## 2021-03-18 NOTE — Progress Notes (Signed)
   PRENATAL VISIT NOTE  Subjective:  Diamond Forbes is a 25 y.o. G2P0010 at [redacted]w[redacted]d being seen today for ongoing prenatal care.  She is currently monitored for the following issues for this high-risk pregnancy and has Supervision of other normal pregnancy, antepartum; Marijuana use; Mild asthma; Scoliosis; and Diet controlled gestational diabetes mellitus (GDM) in third trimester on their problem list.  Patient reports no complaints.  Contractions: Irregular. Vag. Bleeding: None.  Movement: Present. Denies leaking of fluid.   The following portions of the patient's history were reviewed and updated as appropriate: allergies, current medications, past family history, past medical history, past social history, past surgical history and problem list.   Objective:   Vitals:   03/18/21 1010  BP: 125/85  Pulse: 80  Weight: 190 lb (86.2 kg)    Fetal Status: Fetal Heart Rate (bpm): 149   Movement: Present     General:  Alert, oriented and cooperative. Patient is in no acute distress.  Skin: Skin is warm and dry. No rash noted.   Cardiovascular: Normal heart rate noted  Respiratory: Normal respiratory effort, no problems with respiration noted  Abdomen: Soft, gravid, appropriate for gestational age.  Pain/Pressure: Present     Pelvic: Cervical exam deferred        Extremities: Normal range of motion.  Edema: None  Mental Status: Normal mood and affect. Normal behavior. Normal judgment and thought content.   Assessment and Plan:  Pregnancy: G2P0010 at [redacted]w[redacted]d 1. [redacted] weeks gestation of pregnancy  2. Supervision of other normal pregnancy, antepartum FHT and FH normal  3. Diet controlled gestational diabetes mellitus (GDM) in third trimester Hasn't checked CBGs this week.  As EFW 99% and uncontrolled at this point, will schedule for induction at 38 weeks. Patient amenable to this plan.  4. Mild intermittent asthma without complication  Preterm labor symptoms and general obstetric precautions  including but not limited to vaginal bleeding, contractions, leaking of fluid and fetal movement were reviewed in detail with the patient. Please refer to After Visit Summary for other counseling recommendations.   No follow-ups on file.  Future Appointments  Date Time Provider Department Center  03/24/2021 10:00 AM Levie Heritage, DO [facility] None  03/24/2021  1:15 PM [facility]  03/31/2021 10:15 AM [facility]  04/01/2021 10:30 AM Willodean Rosenthal, MD [facility] None    Levie Heritage, DO

## 2021-03-19 ENCOUNTER — Encounter (HOSPITAL_COMMUNITY): Payer: Self-pay | Admitting: *Deleted

## 2021-03-19 ENCOUNTER — Telehealth (HOSPITAL_COMMUNITY): Payer: Self-pay | Admitting: *Deleted

## 2021-03-19 NOTE — Telephone Encounter (Signed)
Preadmission screen  

## 2021-03-22 ENCOUNTER — Encounter (HOSPITAL_COMMUNITY): Payer: Self-pay | Admitting: Family Medicine

## 2021-03-22 ENCOUNTER — Inpatient Hospital Stay (HOSPITAL_COMMUNITY): Payer: Medicaid Other

## 2021-03-22 ENCOUNTER — Other Ambulatory Visit: Payer: Self-pay

## 2021-03-22 ENCOUNTER — Inpatient Hospital Stay (HOSPITAL_COMMUNITY)
Admission: AD | Admit: 2021-03-22 | Discharge: 2021-03-25 | DRG: 807 | Disposition: A | Discharge status: Home / Self Care | Payer: Medicaid Other | Attending: Family Medicine | Admitting: Family Medicine

## 2021-03-22 DIAGNOSIS — O24410 Gestational diabetes mellitus in pregnancy, diet controlled: Secondary | ICD-10-CM | POA: Diagnosis present

## 2021-03-22 DIAGNOSIS — J45909 Unspecified asthma, uncomplicated: Secondary | ICD-10-CM | POA: Diagnosis present

## 2021-03-22 DIAGNOSIS — O9081 Anemia of the puerperium: Secondary | ICD-10-CM | POA: Diagnosis not present

## 2021-03-22 DIAGNOSIS — O24420 Gestational diabetes mellitus in childbirth, diet controlled: Secondary | ICD-10-CM | POA: Diagnosis present

## 2021-03-22 DIAGNOSIS — O165 Unspecified maternal hypertension, complicating the puerperium: Secondary | ICD-10-CM | POA: Diagnosis not present

## 2021-03-22 DIAGNOSIS — Z3A38 38 weeks gestation of pregnancy: Secondary | ICD-10-CM | POA: Diagnosis not present

## 2021-03-22 DIAGNOSIS — O9952 Diseases of the respiratory system complicating childbirth: Secondary | ICD-10-CM | POA: Diagnosis present

## 2021-03-22 DIAGNOSIS — Z3480 Encounter for supervision of other normal pregnancy, unspecified trimester: Secondary | ICD-10-CM

## 2021-03-22 DIAGNOSIS — Z3490 Encounter for supervision of normal pregnancy, unspecified, unspecified trimester: Secondary | ICD-10-CM | POA: Diagnosis present

## 2021-03-22 DIAGNOSIS — Z20822 Contact with and (suspected) exposure to covid-19: Secondary | ICD-10-CM | POA: Diagnosis present

## 2021-03-22 LAB — CBC
HCT: 32.4 % — ABNORMAL LOW (ref 36.0–46.0)
Hemoglobin: 10.3 g/dL — ABNORMAL LOW (ref 12.0–15.0)
MCH: 27 pg (ref 26.0–34.0)
MCHC: 31.8 g/dL (ref 30.0–36.0)
MCV: 85 fL (ref 80.0–100.0)
Platelets: 263 10*3/uL (ref 150–400)
RBC: 3.81 MIL/uL — ABNORMAL LOW (ref 3.87–5.11)
RDW: 21.4 % — ABNORMAL HIGH (ref 11.5–15.5)
WBC: 3.8 10*3/uL — ABNORMAL LOW (ref 4.0–10.5)
nRBC: 0 % (ref 0.0–0.2)

## 2021-03-22 LAB — TYPE AND SCREEN
ABO/RH(D): O POS
Antibody Screen: NEGATIVE

## 2021-03-22 LAB — GLUCOSE, CAPILLARY
Glucose-Capillary: 94 mg/dL (ref 70–99)
Glucose-Capillary: 97 mg/dL (ref 70–99)

## 2021-03-22 LAB — RESP PANEL BY RT-PCR (FLU A&B, COVID) ARPGX2
Influenza A by PCR: NEGATIVE
Influenza B by PCR: NEGATIVE
SARS Coronavirus 2 by RT PCR: NEGATIVE

## 2021-03-22 MED ORDER — OXYTOCIN BOLUS FROM INFUSION
333.0000 mL | Freq: Once | INTRAVENOUS | Status: AC
  Administered 2021-03-23: 333 mL via INTRAVENOUS

## 2021-03-22 MED ORDER — MISOPROSTOL 25 MCG QUARTER TABLET
25.0000 ug | ORAL_TABLET | ORAL | Status: DC | PRN
  Administered 2021-03-22: 25 ug via VAGINAL
  Filled 2021-03-22: qty 1

## 2021-03-22 MED ORDER — MISOPROSTOL 25 MCG QUARTER TABLET
ORAL_TABLET | ORAL | Status: AC
  Filled 2021-03-22: qty 1

## 2021-03-22 MED ORDER — MISOPROSTOL 25 MCG QUARTER TABLET
25.0000 ug | ORAL_TABLET | ORAL | Status: DC | PRN
  Administered 2021-03-22: 25 ug via VAGINAL

## 2021-03-22 MED ORDER — TERBUTALINE SULFATE 1 MG/ML IJ SOLN
0.2500 mg | Freq: Once | INTRAMUSCULAR | Status: DC | PRN
  Filled 2021-03-22: qty 1

## 2021-03-22 MED ORDER — OXYCODONE-ACETAMINOPHEN 5-325 MG PO TABS
2.0000 | ORAL_TABLET | ORAL | Status: DC | PRN
Start: 2021-03-22 — End: 2021-03-23

## 2021-03-22 MED ORDER — LACTATED RINGERS IV SOLN: INTRAVENOUS | Status: DC

## 2021-03-22 MED ORDER — ACETAMINOPHEN 325 MG PO TABS: 650.0000 mg | ORAL_TABLET | ORAL | Status: DC | PRN

## 2021-03-22 MED ORDER — ONDANSETRON HCL 4 MG/2ML IJ SOLN
4.0000 mg | Freq: Four times a day (QID) | INTRAMUSCULAR | Status: DC | PRN
  Administered 2021-03-23: 4 mg via INTRAVENOUS
  Filled 2021-03-22: qty 2

## 2021-03-22 MED ORDER — LACTATED RINGERS IV SOLN: 500.0000 mL | INTRAVENOUS | Status: DC | PRN

## 2021-03-22 MED ORDER — LIDOCAINE HCL (PF) 1 % IJ SOLN: 30.0000 mL | INTRAMUSCULAR | Status: DC | PRN

## 2021-03-22 MED ORDER — SOD CITRATE-CITRIC ACID 500-334 MG/5ML PO SOLN: 30.0000 mL | ORAL | Status: DC | PRN

## 2021-03-22 MED ORDER — MISOPROSTOL 50MCG HALF TABLET
ORAL_TABLET | ORAL | Status: AC
  Filled 2021-03-22: qty 1

## 2021-03-22 MED ORDER — OXYTOCIN-SODIUM CHLORIDE 30-0.9 UT/500ML-% IV SOLN
2.5000 [IU]/h | INTRAVENOUS | Status: DC
  Filled 2021-03-22 (×2): qty 500

## 2021-03-22 MED ORDER — MISOPROSTOL 50MCG HALF TABLET
50.0000 ug | ORAL_TABLET | ORAL | Status: DC | PRN
  Administered 2021-03-22: 50 ug via BUCCAL

## 2021-03-22 MED ORDER — OXYCODONE-ACETAMINOPHEN 5-325 MG PO TABS
1.0000 | ORAL_TABLET | ORAL | Status: DC | PRN
Start: 2021-03-22 — End: 2021-03-23

## 2021-03-22 NOTE — H&P (Addendum)
OB ADMISSION/ HISTORY & PHYSICAL:  Admission Date: 03/22/2021 12:53 PM  Admit Diagnosis: Encounter for induction of labor [Z34.90]    Diamond Forbes is a 25 y.o. female presenting for IOL for A1GDM at [redacted]w[redacted]d. She is comfortable and excited for baby. She endorses +FM. FOB "Minna Merritts"  Present at Bedside and supportive.   Prenatal History: G2P0010   EDC : 04/05/2021, by Last Menstrual Period  Prenatal care at [facility] since first trimester   Prenatal course complicated by: [id number]  Hx of asthma- managed with albuterol inhaler. Last use was prior to pregnancy.   Prenatal Labs: ABO, Rh: O (12/14 1059)  Antibody: PENDING (05/16 1330) Rubella: 2.71 (12/14 1059)  RPR: Non Reactive (03/10 0933)  HBsAg: Negative (12/14 1059)  HIV: Non Reactive (03/10 0933)  GBS: Negative/-- (05/05 1057)  1 hr Glucola : 141, 2hr :122 Fasting of 93 Genetic Screening: Normal  Ultrasound: Vertex, Posterior fundal placenta, EFW 3535g (7#13oz) @ 37wks. AC >99%tile.   Vaccines: TDaP          UTD         Flu             Declined                     COVID-19 UTD    Maternal Diabetes: Yes:  Diabetes Type:  Diet controlled Genetic Screening: Normal Maternal Ultrasounds/Referrals: Normal Fetal Ultrasounds or other Referrals:  None Maternal Substance Abuse:  No Significant Maternal Medications:  None Significant Maternal Lab Results:  Group B Strep negative Other Comments:  None  Medical / Surgical History :  Past medical history:  Past Medical History:  Diagnosis Date   Asthma    Concussion    Gestational diabetes      Past surgical history:  Past Surgical History:  Procedure Laterality Date   NO PAST SURGERIES       Family History:  Family History  Problem Relation Age of Onset   Healthy Mother    Healthy Father      Social History:  reports that she has never smoked. She has never used smokeless tobacco. She reports previous alcohol use. She reports current drug use. Drug:  Marijuana.   Allergies: Cephalexin   Current Medications at time of admission:  Medications Prior to Admission  Medication Sig Dispense Refill Last Dose   Accu-Chek Softclix Lancets lancets 1 each by Other route 4 (four) times daily. 100 each 12    albuterol (VENTOLIN HFA) 108 (90 Base) MCG/ACT inhaler Inhale 2 puffs into the lungs every 6 (six) hours as needed.      albuterol (VENTOLIN HFA) 108 (90 Base) MCG/ACT inhaler Inhale 2 puffs into the lungs every 6 (six) hours as needed for wheezing or shortness of breath. 8 g 2    Blood Glucose Monitoring Suppl (ACCU-CHEK NANO SMARTVIEW) w/Device KIT 1 kit by Subdermal route as directed. Check blood sugars for fasting, and two hours after breakfast, lunch and dinner (4 checks daily) 1 kit 0    cyclobenzaprine (FLEXERIL) 10 MG tablet Take 1 tablet (10 mg total) by mouth 2 (two) times daily as needed for muscle spasms. (Patient not taking: No sig reported) 20 tablet 0    glucose blood (ACCU-CHEK SMARTVIEW) test strip Use as instructed to check blood sugars 100 each 12    ondansetron (ZOFRAN ODT) 4 MG disintegrating tablet Take 1 tablet (4 mg total) by mouth every 8 (eight) hours as needed for nausea or vomiting. (Patient  not taking: No sig reported) 60 tablet 2    Prenatal Vit-Fe Fumarate-FA (PRENATAL VITAMINS PO) Take by mouth.      valACYclovir (VALTREX) 500 MG tablet Take 500 mg by mouth daily. (Patient not taking: No sig reported)        Review of Systems: Review of Systems  All other systems reviewed and are negative.   Physical Exam: Vital signs and nursing notes reviewed.  Patient Vitals for the past 24 hrs:  BP Temp Temp src Pulse Resp Height Weight  03/22/21 1451 131/85 -- -- 75 17 -- --  03/22/21 1402 134/88 -- -- 87 -- -- --  03/22/21 1333 -- -- -- -- -- $Rem'5\' 5"'fMct$  (1.651 m) 88 kg  03/22/21 1322 134/87 98.6 F (37 C) Oral 100 16 -- --     General: AAO x 3, NAD, coping well.  Heart: RRR Lungs:CTAB Abdomen: Gravid, NT, Leopold's  Vertex Extremities: minimal non-pitting edema Genitalia / VE: Dilation: 1.5 Effacement (%): 30 Station: Ballotable Presentation: Vertex Exam by:: Gailen Shelter, CNM student   FHR:145 BPM, moderate variability, present accels, absent decels TOCO: Ctx irregular with UI.   Procedure:IP Foley balloon.  R/B discussed with patient and FOB. Pt agreeable to plan. IP foley balloon placed by SNM with ease. 60cc of LR inserted into balloon. Traction applied.     Labs:   Pending T&S, CBC, RPR  Recent Labs    03/22/21 1340  WBC 3.8*  HGB 10.3*  HCT 32.4*  PLT 263     Assessment:  25 y.o. G2P0010 at [redacted]w[redacted]d A1GDM-Uncontrolled  Hx of asthma 1. IOL-A1GDM 2. FHR category 1 3. GBS negative 4. Desires unmedicated vaginal delivery. Planning  5. Breastfeeding 6. Placenta disposal L&D  Plan:  1. Admit to BS 2. Routine L&D orders  -Q4 CBGs while in latent labor.   -Pt may have carb modified/light laboring diet 3. Analgesia/anesthesia PRN  4. Active management  - Cytotecx1  -IP foley placed. Continue with Cytotec PRN until balloon expelled.    5. Caution for NSVD EFW ~8lbs.    Dr Kennon Rounds notified of admission / plan of care  [name], BSN, RNC-OB  Student Nurse-Midwife   03/22/2021  3:30 PM  Attestation of Supervision of Student:  I confirm that I have verified the information documented in the nurse midwife student's note and that I have also personally reperformed the history, physical exam and all medical decision making activities.  I have verified that all services and findings are accurately documented in this student's note; and I agree with management and plan as outlined in the documentation. I have also made any necessary editorial changes.   Laury Deep, [place] for [employer], [employer] 03/22/2021 6:37 PM

## 2021-03-22 NOTE — Progress Notes (Signed)
S: Doing well.  O: Vitals:   03/22/21 1333 03/22/21 1402 03/22/21 1451 03/22/21 1603  BP:  134/88 131/85 137/75  Pulse:  87 75 75  Resp:   17   Temp:      TempSrc:      Weight: 88 kg     Height: 5\' 5"  (1.651 m)      CBG (last 3)  Recent Labs    03/22/21 1643  GLUCAP 94     FHT:  FHR: 145 bpm, variability: moderate,  accelerations:  Present,  decelerations:  Absent UC:   regular, every 1.5-2 minutes  SVE:   Dilation: 4 Effacement (%): 40 Station: -3 Exam by:: Mary [email or web address] Johnson, RN   A / P: Induction of labor due to uncontrolled A1GDM. S/p FB placement and cytotec x1.  -FB expelled. Cervix remains 40% effaced.   -Plan to repeat cytotec.   Glucose: Stable. Continue to monitor CBGs q4hour in latent labor.  Fetal Wellbeing:  Category I Pain Control:  Labor support without medications Anticipated MOD:  Caution for NSVD, EFW ~8lbs.  Makaley Storts [place]) Danella Deis, BSN, RNC-OB  Student Nurse-Midwife   03/22/2021  6:56 PM

## 2021-03-23 ENCOUNTER — Inpatient Hospital Stay (HOSPITAL_COMMUNITY): Payer: Medicaid Other | Admitting: Anesthesiology

## 2021-03-23 ENCOUNTER — Encounter (HOSPITAL_COMMUNITY): Payer: Self-pay | Admitting: Family Medicine

## 2021-03-23 DIAGNOSIS — O24420 Gestational diabetes mellitus in childbirth, diet controlled: Secondary | ICD-10-CM

## 2021-03-23 DIAGNOSIS — Z3A38 38 weeks gestation of pregnancy: Secondary | ICD-10-CM

## 2021-03-23 LAB — GLUCOSE, CAPILLARY
Glucose-Capillary: 87 mg/dL (ref 70–99)
Glucose-Capillary: 90 mg/dL (ref 70–99)
Glucose-Capillary: 86 mg/dL (ref 70–99)
Glucose-Capillary: 96 mg/dL (ref 70–99)

## 2021-03-23 LAB — RPR: RPR Ser Ql: NONREACTIVE

## 2021-03-23 MED ORDER — LACTATED RINGERS IV SOLN: 500.0000 mL | Freq: Once | INTRAVENOUS | Status: DC

## 2021-03-23 MED ORDER — METHYLERGONOVINE MALEATE 0.2 MG/ML IJ SOLN
INTRAMUSCULAR | Status: AC
  Filled 2021-03-23: qty 1

## 2021-03-23 MED ORDER — TETANUS-DIPHTH-ACELL PERTUSSIS 5-2.5-18.5 LF-MCG/0.5 IM SUSY: 0.5000 mL | PREFILLED_SYRINGE | Freq: Once | INTRAMUSCULAR | Status: DC

## 2021-03-23 MED ORDER — FENTANYL CITRATE (PF) 100 MCG/2ML IJ SOLN: 100.0000 ug | INTRAMUSCULAR | Status: DC | PRN

## 2021-03-23 MED ORDER — DIPHENHYDRAMINE HCL 50 MG/ML IJ SOLN: 12.5000 mg | INTRAMUSCULAR | Status: DC | PRN

## 2021-03-23 MED ORDER — MEASLES, MUMPS & RUBELLA VAC IJ SOLR: 0.5000 mL | Freq: Once | INTRAMUSCULAR | Status: DC

## 2021-03-23 MED ORDER — METHYLERGONOVINE MALEATE 0.2 MG/ML IJ SOLN
0.2000 mg | Freq: Once | INTRAMUSCULAR | Status: AC
  Administered 2021-03-23: 0.2 mg via INTRAMUSCULAR

## 2021-03-23 MED ORDER — PHENYLEPHRINE 40 MCG/ML (10ML) SYRINGE FOR IV PUSH (FOR BLOOD PRESSURE SUPPORT)
80.0000 ug | PREFILLED_SYRINGE | INTRAVENOUS | Status: DC | PRN
  Administered 2021-03-23: 80 ug via INTRAVENOUS

## 2021-03-23 MED ORDER — OXYTOCIN-SODIUM CHLORIDE 30-0.9 UT/500ML-% IV SOLN
1.0000 m[IU]/min | INTRAVENOUS | Status: DC
  Administered 2021-03-23: 2 m[IU]/min via INTRAVENOUS

## 2021-03-23 MED ORDER — FENTANYL-BUPIVACAINE-NACL 0.5-0.125-0.9 MG/250ML-% EP SOLN
EPIDURAL | Status: AC
  Filled 2021-03-23: qty 250

## 2021-03-23 MED ORDER — ONDANSETRON HCL 4 MG PO TABS: 4.0000 mg | ORAL_TABLET | ORAL | Status: DC | PRN

## 2021-03-23 MED ORDER — SIMETHICONE 80 MG PO CHEW
80.0000 mg | CHEWABLE_TABLET | ORAL | Status: DC | PRN
Start: 2021-03-23 — End: 2021-03-25

## 2021-03-23 MED ORDER — EPHEDRINE 5 MG/ML INJ: 10.0000 mg | INTRAVENOUS | Status: DC | PRN

## 2021-03-23 MED ORDER — BENZOCAINE-MENTHOL 20-0.5 % EX AERO
1.0000 "application " | INHALATION_SPRAY | CUTANEOUS | Status: DC | PRN
  Administered 2021-03-23: 1 via TOPICAL
  Filled 2021-03-23: qty 56

## 2021-03-23 MED ORDER — DIBUCAINE (PERIANAL) 1 % EX OINT
1.0000 | TOPICAL_OINTMENT | CUTANEOUS | Status: DC | PRN
Start: 2021-03-23 — End: 2021-03-25

## 2021-03-23 MED ORDER — ONDANSETRON HCL 4 MG/2ML IJ SOLN: 4.0000 mg | INTRAMUSCULAR | Status: DC | PRN

## 2021-03-23 MED ORDER — TERBUTALINE SULFATE 1 MG/ML IJ SOLN: 0.2500 mg | Freq: Once | INTRAMUSCULAR | Status: DC | PRN

## 2021-03-23 MED ORDER — FENTANYL-BUPIVACAINE-NACL 0.5-0.125-0.9 MG/250ML-% EP SOLN
12.0000 mL/h | EPIDURAL | Status: DC | PRN
  Administered 2021-03-23: 12 mL/h via EPIDURAL

## 2021-03-23 MED ORDER — SENNOSIDES-DOCUSATE SODIUM 8.6-50 MG PO TABS
2.0000 | ORAL_TABLET | ORAL | Status: DC
  Administered 2021-03-24 – 2021-03-25 (×2): 2 via ORAL
  Filled 2021-03-23 (×2): qty 2

## 2021-03-23 MED ORDER — ACETAMINOPHEN 325 MG PO TABS
650.0000 mg | ORAL_TABLET | ORAL | Status: DC | PRN
Start: 2021-03-23 — End: 2021-03-25
  Administered 2021-03-23 – 2021-03-24 (×2): 650 mg via ORAL
  Filled 2021-03-23 (×2): qty 2

## 2021-03-23 MED ORDER — DIPHENHYDRAMINE HCL 25 MG PO CAPS: 25.0000 mg | ORAL_CAPSULE | Freq: Four times a day (QID) | ORAL | Status: DC | PRN

## 2021-03-23 MED ORDER — TRANEXAMIC ACID-NACL 1000-0.7 MG/100ML-% IV SOLN
INTRAVENOUS | Status: AC
  Administered 2021-03-23: 1000 mg
  Filled 2021-03-23: qty 100

## 2021-03-23 MED ORDER — ZOLPIDEM TARTRATE 5 MG PO TABS: 5.0000 mg | ORAL_TABLET | Freq: Every evening | ORAL | Status: DC | PRN

## 2021-03-23 MED ORDER — LIDOCAINE-EPINEPHRINE (PF) 2 %-1:200000 IJ SOLN
INTRAMUSCULAR | Status: DC | PRN
  Administered 2021-03-23: 4 mL via EPIDURAL

## 2021-03-23 MED ORDER — WITCH HAZEL-GLYCERIN EX PADS: 1.0000 "application " | MEDICATED_PAD | CUTANEOUS | Status: DC | PRN

## 2021-03-23 MED ORDER — BUTORPHANOL TARTRATE 1 MG/ML IJ SOLN
1.0000 mg | INTRAMUSCULAR | Status: DC | PRN
  Administered 2021-03-23 (×2): 1 mg via INTRAVENOUS
  Filled 2021-03-23 (×2): qty 1

## 2021-03-23 MED ORDER — IBUPROFEN 600 MG PO TABS
600.0000 mg | ORAL_TABLET | Freq: Four times a day (QID) | ORAL | Status: DC
  Administered 2021-03-24 – 2021-03-25 (×7): 600 mg via ORAL
  Filled 2021-03-23 (×7): qty 1

## 2021-03-23 MED ORDER — PHENYLEPHRINE 40 MCG/ML (10ML) SYRINGE FOR IV PUSH (FOR BLOOD PRESSURE SUPPORT)
80.0000 ug | PREFILLED_SYRINGE | INTRAVENOUS | Status: DC | PRN
  Filled 2021-03-23: qty 10

## 2021-03-23 MED ORDER — PRENATAL MULTIVITAMIN CH
1.0000 | ORAL_TABLET | Freq: Every day | ORAL | Status: DC
  Administered 2021-03-24 – 2021-03-25 (×2): 1 via ORAL
  Filled 2021-03-23 (×2): qty 1

## 2021-03-23 MED ORDER — COCONUT OIL OIL: 1.0000 "application " | TOPICAL_OIL | Status: DC | PRN

## 2021-03-23 MED ORDER — TRANEXAMIC ACID-NACL 1000-0.7 MG/100ML-% IV SOLN: 1000.0000 mg | Freq: Once | INTRAVENOUS | Status: DC

## 2021-03-23 NOTE — Lactation Note (Signed)
This note was copied from a baby's chart. Lactation Consultation Note  Patient Name: Diamond Forbes [id number] Date: 03/23/2021 Reason for consult: Initial assessment;Mother's request;Difficult latch;Primapara;1st time breastfeeding;Early term 37-38.6wks Age:23 hours   Infant able to latch in football. Mom's nipples are large so have to work to get infant to latch deep and not just on the nipple. Infant in sitting position in football helped. With breast compression and chin tug, infant showed signs of milk transfer with increase in depth of swallows.   Mom took a breastfeeding class.  Infant able to extend tongue under the finger with suck training.   Plans. 1. To feed based on cues 8-12x in 24 hr period no more than 4 hrs without an attempt. Mom to offer both breasts and look for swallows.              2. If infant unable to latch, Mom taught hand expression and how to offer drops of colostrum and then try latching.                3. Mom to pump with DEBP q 3hrs for 15 minutes.                4. Mom to pace bottle feed any EBP via yellow slow flow nipple after each attempt at the breast.                 5. I and O sheet reviewed.                 6. LC brochure of inpatient and outpatient services reviewed.   Maternal Data Has patient been taught Hand Expression?: Yes Does the patient have breastfeeding experience prior to this delivery?: No  Feeding Mother's Current Feeding Choice: Breast Milk  LATCH Score Latch: Repeated attempts needed to sustain latch, nipple held in mouth throughout feeding, stimulation needed to elicit sucking reflex.  Audible Swallowing: Spontaneous and intermittent  Type of Nipple: Everted at rest and after stimulation  Comfort (Breast/Nipple): Soft / non-tender  Hold (Positioning): Assistance needed to correctly position infant at breast and maintain latch.  LATCH Score: 8   Lactation Tools Discussed/Used Tools: Pump;Flanges Flange Size:  27 Breast pump type: Double-Electric Breast Pump Pump Education: Setup, frequency, and cleaning;Milk Storage Reason for Pumping: increase stimulation Pumping frequency: every 3 hrs for 15 minutes  Interventions Interventions: Breast feeding basics reviewed;Support pillows;Education;Assisted with latch;Position options;Skin to skin;Expressed milk;Adjust position;Breast compression;DEBP;Hand express  Discharge Pump: Manual WIC Program: Yes  Consult Status Consult Status: Follow-up Date: 03/24/21 Follow-up type: In-patient    Diamond Olinde  Forbes 03/23/2021, 8:53 PM

## 2021-03-23 NOTE — Discharge Summary (Signed)
Postpartum Discharge Summary     Patient Name: Diamond Forbes DOB: [birth year] MRN: [id number]  Date of admission: 03/22/2021 Delivery date:03/23/2021  Delivering provider: Nicolette Bang  Date of discharge: 03/25/2021  Admitting diagnosis: Encounter for induction of labor [Z34.90] Intrauterine pregnancy: [redacted]w[redacted]d     Secondary diagnosis:  Active Problems:   Diet controlled gestational diabetes mellitus (GDM) in third trimester   Encounter for induction of labor   Postpartum hypertension  Additional problems: none    Discharge diagnosis: Term Pregnancy Delivered, GDM A1 and postpartum hypertension                                             Post partum procedures:none Augmentation: AROM, Pitocin, Cytotec and IP Foley Complications: None  Hospital course: Induction of Labor With Vaginal Delivery   25 y.o. yo G2P0010 at [redacted]w[redacted]d was admitted to the hospital 03/22/2021 for induction of labor.  Indication for induction: A1 DM.  Patient had an uncomplicated labor course as follows: Membrane Rupture Time/Date: 12:31 PM ,03/23/2021   Delivery Method:Vaginal, Spontaneous  Episiotomy: None  Lacerations:  None  Details of delivery can be found in separate delivery note.  Patient had a postpartum course remarkable for being started on Norvasc $RemoveBe'5mg'FNKICsyRC$  on PPD#1 due to multiple elevated BPs after delivery. She was asymptomatic with nl LFTs/platelets and will have a BP check in 1 week. Patient is discharged home 03/25/21.  Newborn Data: Birth date:03/23/2021  Birth time:3:16 PM  Gender:Female  Living status:Living  Apgars:7 ,8  Weight:4000 g (8lb 13.1oz)  Magnesium Sulfate received: No BMZ received: No Rhophylac:N/A  MMR:N/A T-DaP:offered postpartum Flu: No Transfusion:No  Physical exam  Vitals:   03/24/21 0230 03/24/21 0630 03/24/21 2005 03/25/21 0600  BP: 122/73 (!) 119/56 117/76 116/80  Pulse: 74 82 79 70  Resp: $Remo'16 16 18 19  'Xlmsf$ Temp: 98.4 F (36.9 C) 98 F (36.7 C)  97.7 F (36.5 C) 98.1 F (36.7 C)  TempSrc: Oral Oral Oral Oral  SpO2: 99% 98% 100% 100%  Weight:      Height:       General: alert and cooperative Lochia: appropriate Uterine Fundus: firm Incision: N/A DVT Evaluation: No evidence of DVT seen on physical exam. Labs: Lab Results  Component Value Date   WBC 7.9 03/24/2021   HGB 9.1 (L) 03/24/2021   HCT 31.1 (L) 03/24/2021   MCV 94.2 03/24/2021   PLT 246 03/24/2021   CMP Latest Ref Rng & Units 03/24/2021  Glucose 70 - 99 mg/dL 132(H)  BUN 6 - 20 mg/dL 8  Creatinine 0.44 - 1.00 mg/dL 1.10(H)  Sodium 135 - 145 mmol/L 136  Potassium 3.5 - 5.1 mmol/L 3.5  Chloride 98 - 111 mmol/L 107  CO2 22 - 32 mmol/L 21(L)  Calcium 8.9 - 10.3 mg/dL 8.5(L)  Total Protein 6.5 - 8.1 g/dL 4.5(L)  Total Bilirubin 0.3 - 1.2 mg/dL 0.3  Alkaline Phos 38 - 126 U/L 109  AST 15 - 41 U/L 26  ALT 0 - 44 U/L 15   Edinburgh Score: No flowsheet data found.   After visit meds:  Allergies as of 03/25/2021      Reactions   Cephalexin Diarrhea, Rash   "severe diarrhea"      Medication List    STOP taking these medications   Accu-Chek Nano SmartView w/Device Kit   Accu-Chek SmartView test  strip Generic drug: glucose blood   Accu-Chek Softclix Lancets lancets   cyclobenzaprine 10 MG tablet Commonly known as: FLEXERIL   ondansetron 4 MG disintegrating tablet Commonly known as: Zofran ODT   valACYclovir 500 MG tablet Commonly known as: VALTREX     TAKE these medications   albuterol 108 (90 Base) MCG/ACT inhaler Commonly known as: VENTOLIN HFA Inhale 2 puffs into the lungs every 6 (six) hours as needed.   albuterol 108 (90 Base) MCG/ACT inhaler Commonly known as: VENTOLIN HFA Inhale 2 puffs into the lungs every 6 (six) hours as needed for wheezing or shortness of breath.   amLODipine 5 MG tablet Commonly known as: NORVASC Take 1 tablet (5 mg total) by mouth daily.   ibuprofen 600 MG tablet Commonly known as: ADVIL Take 1 tablet  (600 mg total) by mouth every 6 (six) hours as needed.   norethindrone 0.35 MG tablet Commonly known as: MICRONOR Take 1 tablet (0.35 mg total) by mouth daily. Start taking on: April 11, 2021   PRENATAL VITAMINS PO Take by mouth.        Discharge home in stable condition Infant Feeding: Breast Infant Disposition:home with mother Discharge instruction: per After Visit Summary and Postpartum booklet. Activity: Advance as tolerated. Pelvic rest for 6 weeks.  Diet: routine diet Future Appointments: Future Appointments  Date Time Provider [place]  03/30/2021  1:45 PM [facility] NURSE [facility] None  05/06/2021 10:45 AM Truett Mainland, DO [facility] None   Follow up Visit:   Please schedule this patient for a In person postpartum visit in 4 weeks with the following provider: Any provider. Additional Postpartum F/U:2 hour GTT  High risk pregnancy complicated by: GDM Delivery mode:  Vaginal, Spontaneous  Anticipated Birth Control:  POPs   03/25/2021 Myrtis Ser, CNM 7:34 AM

## 2021-03-23 NOTE — Lactation Note (Signed)
This note was copied from a baby's chart. Lactation Consultation Note  Patient Name: Diamond Forbes [id number] Date: 03/23/2021 Reason for consult: L&D Initial assessment;Mother's request;Primapara;1st time breastfeeding;Early term 37-38.6wks;Maternal endocrine disorder Age: < 1 hr Infant cueing on arrival. LC assisted latching infant to breast on left side in laid back position. Signs of milk transfer noted.  Mom feeling a little tired, Dad came over to help Mom supporting latch at the breasts.  Further LC support to be provided on the floor.    Maternal Data Has patient been taught Hand Expression?: Yes Does the patient have breastfeeding experience prior to this delivery?: No  Feeding Mother's Current Feeding Choice: Breast Milk  LATCH Score Latch: Repeated attempts needed to sustain latch, nipple held in mouth throughout feeding, stimulation needed to elicit sucking reflex.  Audible Swallowing: A few with stimulation  Type of Nipple: Everted at rest and after stimulation  Comfort (Breast/Nipple): Soft / non-tender  Hold (Positioning): Assistance needed to correctly position infant at breast and maintain latch.  LATCH Score: 7   Lactation Tools Discussed/Used    Interventions Interventions: Breast feeding basics reviewed;Support pillows;Education;Assisted with latch;Skin to skin;Expressed milk;Breast compression;Adjust position  Discharge    Consult Status Consult Status: Follow-up Date: 03/24/21 Follow-up type: In-patient    Diamond Maselli  Forbes 03/23/2021, 4:44 PM

## 2021-03-23 NOTE — Progress Notes (Signed)
LABOR PROGRESS NOTE  Diamond Forbes is a 25 y.o. G2P0010 at [redacted]w[redacted]d  admitted for IOL for A1GDM  Subjective: Feeling more uncomfortable. Has received pain medication.   Objective: BP 113/70   Pulse 82   Temp 98.3 F (36.8 C) (Oral)   Resp 16   Ht 5\' 5"  (1.651 m)   Wt 88 kg   LMP 06/29/2020   BMI 32.28 kg/m  or  Vitals:   03/23/21 1031 03/23/21 1101 03/23/21 1130 03/23/21 1200  BP: (!) 106/53 (!) 103/53 106/60 113/70  Pulse: 78 73 78 82  Resp: 16     Temp:      TempSrc:      Weight:      Height:        Dilation: 6 Effacement (%): 50 Cervical Position: Posterior Station: -1 Presentation: Vertex Exam by:: [email or web address], DO FHT: baseline rate 125, moderate varibility, +acel, no decel Toco: q2-4 min with periods of irregular contractions   Labs: Lab Results  Component Value Date   WBC 3.8 (L) 03/22/2021   HGB 10.3 (L) 03/22/2021   HCT 32.4 (L) 03/22/2021   MCV 85.0 03/22/2021   PLT 263 03/22/2021    Patient Active Problem List   Diagnosis Date Noted  . Encounter for induction of labor 03/22/2021  . Diet controlled gestational diabetes mellitus (GDM) in third trimester 02/25/2021  . Supervision of other normal pregnancy, antepartum 09/17/2020  . Mild asthma 03/09/2017  . Scoliosis 03/09/2017  . Marijuana use 01/31/2017    Assessment / Plan: 25 y.o. G2P0010 at [redacted]w[redacted]d here for IOL for A1GDM. Last CBG 86.   Labor: On pitocin at 20 mu/min, continue to titrate. AROM performed with scant amount of clear fluid.  Fetal Wellbeing:  Cat I  Pain Control:  IV pain medication  Anticipated MOD:  NSVD   [redacted]w[redacted]d, D.O. OB Fellow  03/23/2021, 12:34 PM

## 2021-03-23 NOTE — Progress Notes (Addendum)
Patient Vitals for the past 4 hrs:  BP Temp Temp src Pulse Resp  03/22/21 2316 122/79 98.6 F (37 C) Oral 62 16  03/22/21 2112 123/70 -- -- 74 --   Ctx are mild.  Cx 4/40/ballottable at 2300. 3rd cytotec placed. FHR Cat 1. Blood sugars <100. Will start pitocin when cx thins

## 2021-03-23 NOTE — Anesthesia Procedure Notes (Signed)
Epidural Patient location during procedure: OB Start time: 03/23/2021 1:55 PM End time: 03/23/2021 2:05 PM  Staffing Anesthesiologist: Elmer Picker, MD Performed: anesthesiologist   Preanesthetic Checklist Completed: patient identified, IV checked, risks and benefits discussed, monitors and equipment checked, pre-op evaluation and timeout performed  Epidural Patient position: sitting Prep: DuraPrep and site prepped and draped Patient monitoring: continuous pulse ox, blood pressure, heart rate and cardiac monitor Approach: midline Location: L3-L4 Injection technique: LOR air  Needle:  Needle type: Tuohy  Needle gauge: 17 G Needle length: 9 cm Needle insertion depth: 5 cm Catheter type: closed end flexible Catheter size: 19 Gauge Catheter at skin depth: 10 cm Test dose: negative  Assessment Sensory level: T8 Events: blood not aspirated, injection not painful, no injection resistance, no paresthesia and negative IV test  Additional Notes Patient identified. Risks/Benefits/Options discussed with patient including but not limited to bleeding, infection, nerve damage, paralysis, failed block, incomplete pain control, headache, blood pressure changes, nausea, vomiting, reactions to medication both or allergic, itching and postpartum back pain. Confirmed with bedside nurse the patient's most recent platelet count. Confirmed with patient that they are not currently taking any anticoagulation, have any bleeding history or any family history of bleeding disorders. Patient expressed understanding and wished to proceed. All questions were answered. Sterile technique was used throughout the entire procedure. Please see nursing notes for vital signs. Test dose was given through epidural catheter and negative prior to continuing to dose epidural or start infusion. Warning signs of high block given to the patient including shortness of breath, tingling/numbness in hands, complete motor block,  or any concerning symptoms with instructions to call for help. Patient was given instructions on fall risk and not to get out of bed. All questions and concerns addressed with instructions to call with any issues or inadequate analgesia.  Reason for block:procedure for pain

## 2021-03-23 NOTE — Anesthesia Preprocedure Evaluation (Signed)
Anesthesia Evaluation  Patient identified by MRN, date of birth, ID band Patient awake    Reviewed: Allergy & Precautions, NPO status , Patient's Chart, lab work & pertinent test results  Airway Mallampati: III  TM Distance: >3 FB Neck ROM: Full    Dental no notable dental hx.    Pulmonary asthma ,    Pulmonary exam normal breath sounds clear to auscultation       Cardiovascular negative cardio ROS Normal cardiovascular exam Rhythm:Regular Rate:Normal     Neuro/Psych negative neurological ROS  negative psych ROS   GI/Hepatic negative GI ROS, (+)     substance abuse  marijuana use,   Endo/Other  negative endocrine ROSdiabetes, Gestational  Renal/GU negative Renal ROS  negative genitourinary   Musculoskeletal negative musculoskeletal ROS (+)   Abdominal   Peds  Hematology negative hematology ROS (+)   Anesthesia Other Findings   Reproductive/Obstetrics (+) Pregnancy                             Anesthesia Physical Anesthesia Plan  ASA: III  Anesthesia Plan: Epidural   Post-op Pain Management:    Induction:   PONV Risk Score and Plan: Treatment may vary due to age or medical condition  Airway Management Planned: Natural Airway  Additional Equipment:   Intra-op Plan:   Post-operative Plan:   Informed Consent: I have reviewed the patients History and Physical, chart, labs and discussed the procedure including the risks, benefits and alternatives for the proposed anesthesia with the patient or authorized representative who has indicated his/her understanding and acceptance.       Plan Discussed with: Anesthesiologist  Anesthesia Plan Comments: (Patient identified. Risks, benefits, options discussed with patient including but not limited to bleeding, infection, nerve damage, paralysis, failed block, incomplete pain control, headache, blood pressure changes, nausea, vomiting,  reactions to medication, itching, and post partum back pain. Confirmed with bedside nurse the patient's most recent platelet count. Confirmed with the patient that they are not taking any anticoagulation, have any bleeding history or any family history of bleeding disorders. Patient expressed understanding and wishes to proceed. All questions were answered. )        Anesthesia Quick Evaluation

## 2021-03-24 ENCOUNTER — Encounter: Payer: Medicaid Other | Admitting: Family Medicine

## 2021-03-24 ENCOUNTER — Other Ambulatory Visit: Payer: Medicaid Other

## 2021-03-24 LAB — CBC
HCT: 31.1 % — ABNORMAL LOW (ref 36.0–46.0)
Hemoglobin: 9.1 g/dL — ABNORMAL LOW (ref 12.0–15.0)
MCH: 27.6 pg (ref 26.0–34.0)
MCHC: 29.3 g/dL — ABNORMAL LOW (ref 30.0–36.0)
MCV: 94.2 fL (ref 80.0–100.0)
Platelets: 246 10*3/uL (ref 150–400)
RBC: 3.3 MIL/uL — ABNORMAL LOW (ref 3.87–5.11)
RDW: 22.1 % — ABNORMAL HIGH (ref 11.5–15.5)
WBC: 7.9 10*3/uL (ref 4.0–10.5)
nRBC: 0 % (ref 0.0–0.2)

## 2021-03-24 LAB — COMPREHENSIVE METABOLIC PANEL
ALT: 15 U/L (ref 0–44)
AST: 26 U/L (ref 15–41)
Albumin: 2.1 g/dL — ABNORMAL LOW (ref 3.5–5.0)
Alkaline Phosphatase: 109 U/L (ref 38–126)
Anion gap: 8 (ref 5–15)
BUN: 8 mg/dL (ref 6–20)
CO2: 21 mmol/L — ABNORMAL LOW (ref 22–32)
Calcium: 8.5 mg/dL — ABNORMAL LOW (ref 8.9–10.3)
Chloride: 107 mmol/L (ref 98–111)
Creatinine, Ser: 1.1 mg/dL — ABNORMAL HIGH (ref 0.44–1.00)
GFR, Estimated: >60 mL/min (ref >60)
Glucose, Bld: 132 mg/dL — ABNORMAL HIGH (ref 70–99)
Potassium: 3.5 mmol/L (ref 3.5–5.1)
Sodium: 136 mmol/L (ref 135–145)
Total Bilirubin: 0.3 mg/dL (ref 0.3–1.2)
Total Protein: 4.5 g/dL — ABNORMAL LOW (ref 6.5–8.1)

## 2021-03-24 LAB — GLUCOSE, CAPILLARY: Glucose-Capillary: 122 mg/dL — ABNORMAL HIGH (ref 70–99)

## 2021-03-24 MED ORDER — AMLODIPINE BESYLATE 5 MG PO TABS
5.0000 mg | ORAL_TABLET | Freq: Every day | ORAL | Status: DC
  Administered 2021-03-24 – 2021-03-25 (×2): 5 mg via ORAL
  Filled 2021-03-24 (×2): qty 1

## 2021-03-24 MED ORDER — FERROUS SULFATE 325 (65 FE) MG PO TABS
325.0000 mg | ORAL_TABLET | Freq: Every day | ORAL | Status: DC
  Administered 2021-03-24 – 2021-03-25 (×2): 325 mg via ORAL
  Filled 2021-03-24 (×2): qty 1

## 2021-03-24 NOTE — Lactation Note (Addendum)
This note was copied from a baby's chart. Lactation Consultation Note  Patient Name: Diamond Forbes RAXEN'M Date: 03/24/2021 Reason for consult: Follow-up assessment Age:23 hours  LC arrived in mothers room. Mother reports that infant just finished a 20 min feeding. Inquired about feeding consistency and mother reports that infant had  strong suck and that latch was deep behind the nipple. Mother reports that she is hearing infant swallow when ask if swallows present.  Mother reports that this is the second time that infant has breastfeed well.  Offered to assist mother with feeding on the alternate breast. Mother very sleepy . FOB left the room. LC swaddled infant and offered to place infant in crib for mother.   [facility] referral faxed. Maternal Data    Feeding Mother's Current Feeding Choice: Breast Milk  LATCH Score Latch:  (enc mom to call for latch assist/scoring)                  Lactation Tools Discussed/Used    Interventions    Discharge Pump: DEBP WIC Program: No (mother reports she has a Appt Monday to certify with [facility])  Consult Status Consult Status: Follow-up Date: 03/24/21 Follow-up type: In-patient    Stevan Born [facility] 03/24/2021, 11:41 AM

## 2021-03-24 NOTE — Progress Notes (Addendum)
POSTPARTUM PROGRESS NOTE  Post Partum Day 1  Subjective:  Diamond Forbes is a 25 y.o. G2P1011 s/p SVD at [redacted]w[redacted]d.  She reports she is doing well. No acute events overnight. She denies any problems with ambulating, voiding or po intake. Denies nausea or vomiting.  Pain is well controlled.  Lochia is appropriate.  Objective: Blood pressure (!) 119/56, pulse 82, temperature 98 F (36.7 C), temperature source Oral, resp. rate 16, height 5\' 5"  (1.651 m), weight 88 kg, last menstrual period 06/29/2020, SpO2 98 %, unknown if currently breastfeeding.  Physical Exam:  General: sleepy, in no distress Chest: normal work of breathing  Heart:regular rate, distal pulses intact Abdomen: soft, nontender,  Uterine Fundus: firm, appropriately tender DVT Evaluation: No calf swelling or tenderness  Skin: warm, dry  Recent Labs    03/22/21 1340 03/24/21 0507  HGB 10.3* 9.1*  HCT 32.4* 31.1*   Results for orders placed or performed during the hospital encounter of 03/22/21 (from the past 24 hour(s))  Glucose, capillary     Status: None   Collection Time: 03/23/21  9:55 AM  Result Value Ref Range   Glucose-Capillary 86 70 - 99 mg/dL  Glucose, capillary     Status: None   Collection Time: 03/23/21  2:30 PM  Result Value Ref Range   Glucose-Capillary 96 70 - 99 mg/dL  CBC     Status: Abnormal   Collection Time: 03/24/21  5:07 AM  Result Value Ref Range   WBC 7.9 4.0 - 10.5 K/uL   RBC 3.30 (L) 3.87 - 5.11 MIL/uL   Hemoglobin 9.1 (L) 12.0 - 15.0 g/dL   HCT 03/26/21 (L) 12.4 - 58.0 %   MCV 94.2 80.0 - 100.0 fL   MCH 27.6 26.0 - 34.0 pg   MCHC 29.3 (L) 30.0 - 36.0 g/dL   RDW 99.8 (H) 33.8 - 25.0 %   Platelets 246 150 - 400 K/uL   nRBC 0.0 0.0 - 0.2 %  Glucose, capillary     Status: Abnormal   Collection Time: 03/24/21  5:42 AM  Result Value Ref Range   Glucose-Capillary 122 (H) 70 - 99 mg/dL     Assessment/Plan: Diamond Forbes is a 25 y.o. G2P1011 s/p SVD at [redacted]w[redacted]d    PPD#1 - Doing well  Routine postpartum care #GDM: FBG 122 this morning, patient had crackers and juice prior #Anemia: Hgb 9.1, plan to start po iron  #ElevatedBP: Multiple elevated BP readings since delivery. Plan to start Norvasc 5 mg  Contraception: Unsure Feeding: Breast Dispo: Plan for discharge tomorrow, 5/19.   LOS: 2 days    CNM attestation:  I have seen and examined this patient and agree with above documentation in the medical student's note.     Diamond Forbes is a 25 y.o. G2P1011 PPD#1  She is doing well this morning. She reports minimal cramping that is well controlled with Ibuprofen and Tylenol. She has been working with lactation to help baby latch better.   #GDM: FBG 122. Had crackers and juice prior #Anemia: Hgb 9.1, PO iron ordered #Elevated BP's: Patient asymptomatic. CMP ordered. Norvasc 5mg  ordered and to be started today. Initiate babyscripts.  #Contraception: unsure. Considering POP's vs Patch   21, MSN, CNM 03/24/2021 10:22 AM

## 2021-03-24 NOTE — Anesthesia Postprocedure Evaluation (Signed)
Anesthesia Post Note  Patient: Diamond Forbes  Procedure(s) Performed: AN AD HOC LABOR EPIDURAL     Patient location during evaluation: Mother Baby Anesthesia Type: Epidural Level of consciousness: awake and alert and oriented Pain management: satisfactory to patient Vital Signs Assessment: post-procedure vital signs reviewed and stable Respiratory status: spontaneous breathing and nonlabored ventilation Cardiovascular status: stable Postop Assessment: no headache, no backache, no signs of nausea or vomiting, adequate PO intake, patient able to bend at knees and able to ambulate (patient up walking) Anesthetic complications: no   No complications documented.  Last Vitals:  Vitals:   03/24/21 0230 03/24/21 0630  BP: 122/73 (!) 119/56  Pulse: 74 82  Resp: 16 16  Temp: 36.9 C 36.7 C  SpO2: 99% 98%    Last Pain:  Vitals:   03/24/21 1645  TempSrc:   PainSc: 0-No pain   Pain Goal: Patients Stated Pain Goal: 2 (03/24/21 0900)                 Madison Hickman

## 2021-03-24 NOTE — Clinical Social Work Maternal (Signed)
CLINICAL SOCIAL WORK MATERNAL/CHILD NOTE  Patient Details  Name: Marcelle Overlie MRN: [id number] Date of Birth: 06/05/1996  Date:  July 01, 2021  Clinical Social Worker Initiating Note:  Kathrin Greathouse, Jack Date/Time: Initiated:  03/24/21/1154     Child's Name:  Chole'   Biological Parents:  Mother,Father   Need for Interpreter:  None   Reason for Referral:      Address:  [address] [place] [address]    Phone number:  [phone] (home)     Additional phone number:   Household Members/Support Persons (HM/SP):   Household Member/Support Person 1   HM/SP Name Relationship DOB or Age  HM/SP -1 Lakera Viall Significant Other 02-25-1998  HM/SP -2        HM/SP -3        HM/SP -4        HM/SP -5        HM/SP -6        HM/SP -7        HM/SP -8          Natural Supports (not living in the home):  Extended Family   Professional Supports:     Employment: Part-time,Student   Type of Work: Comptroller   Education:      Homebound arranged:    Museum/gallery curator Resources:  Medicaid   Other Resources:  [facility]   Cultural/Religious Considerations Which May Impact Care:    Strengths:  Ability to meet basic needs ,Compliance with medical plan ,Home prepared for child ,Pediatrician chosen   Psychotropic Medications:         Pediatrician:    Careers adviser area  Pediatrician List:   Ecologist Other ([place])  [place]      Pediatrician Fax Number:    Risk Factors/Current Problems:  Substance Use ,Mental Health Concerns    Cognitive State:  Able to Concentrate ,Linear Thinking ,Insightful ,Goal Oriented    Mood/Affect:  Calm ,Relaxed ,Bright ,Comfortable    CSW Assessment: CSW received consult for substance exposed newborn. CSW met with MOB to offer support and complete assessment.     CSW met with MOB at  bedside. CSW congratulated MOB and introduced role. CSW observed MOB eating breakfast and infant resting in bassinet. CSW offered to return at a different time. MOB was welcoming of CSW. CSW explain the reason for the visit. MOB pleasant and receptive to the visit. CSW checked MOB demographics, MOB report she address is not the listed, the current address is [address] [place], [place]. [address]. MOB reports FOB-Adrian Lowella Fairy ([birth year]) is in the household. CSW inquired how MOB feels since giving birth. MOB reports feeling good but tired. MOB reports overall she had a happy pregnancy.   CSW inquired if MOB history of mental health. MOB acknowledges that she was diagnosed with depression as a teenager. MOB reports she was hospitalized at [facility] in [place], [place] for her depression as a teenager and in January 2019. MOB reports in 2019 she was prescribed Zoloft and enrolled in intensive outpatient services. MOB reports the Zoloft caused more anxiety so stopped taking it. MOB reports she has not had any issues with her depression in year. CSW inquired about MOB coping skills. MOB reports she stays focus on her studies and incorporates self-care such as booking a massage and pedicure. MOB reports  she is scheduled to graduate with a degree in Biology this December. CSW praised MOB for her coping skills and achievements. CSW provided education regarding the baby blues period vs. perinatal mood disorders, discussed treatment and gave resources for mental health follow up. MOB reports she was provided with a list of counseling service while in birthing classes. MOB reports she has access to counseling service on campus and has enrolled in the [employer] program that gives access counseling services. While MOB was knowledgeable of postpartum depression symptoms, CSW recommended MOB complete self-evaluation during the postpartum time period using the New Mom Checklist from Postpartum Progress and  encouraged MOB to contact a medical professional if symptoms are noted at any time. MOB receptive to the resources. CSW assessed MOB for safety, MOB denies thoughts of harm to self and others.   CSW inquired if MOB used substance during the pregnancy, MOB reports she used THC only during the first eight weeks for nausea and vomiting. MOB reports she did not use beyond eight weeks. CSW notified MOB of the hospital drug screen policy and that CSW will follow CDS. MOB reports the nurse inform her infant's UDS was negative. CSW asked MOB if she had questions. MOB had no questions.     CSW provided review of Sudden Infant Death Syndrome (SIDS) precautions and informed MOB co-sleeping with the infant. MOB reports the infant will sleep in a bassinet and eventually transition to a crib. MOB reports she has all essential items for the infant. MOB has chosen [facility]. CSW inquired if MOB received WIC/FS. MOB reports she has applied for both [facility] and food stamps. CSW assessed MOB for additional needs. MOB report no further needs. MOB was very appreciative of CSW visit.   Infant's UDS negative. CSW will follow the CDS and notify CPS, if warranted.   CSW identifies no further need for intervention and no barriers to discharge at this time.  CSW Plan/Description:  CSW Will Continue to Monitor Umbilical Cord Tissue Drug Screen Results and Make Report if [facility] Drug Screen Policy Information,No Further Intervention Required/No Barriers to Discharge,Sudden Infant Death Syndrome (SIDS) Education,Perinatal Mood and Anxiety Disorder (PMADs) Education    Lia Hopping, LCSW 03/24/2021, 12:06 PM

## 2021-03-25 ENCOUNTER — Other Ambulatory Visit (HOSPITAL_COMMUNITY): Payer: Self-pay

## 2021-03-25 ENCOUNTER — Ambulatory Visit: Payer: Self-pay

## 2021-03-25 DIAGNOSIS — O165 Unspecified maternal hypertension, complicating the puerperium: Secondary | ICD-10-CM | POA: Diagnosis not present

## 2021-03-25 MED ORDER — NORETHINDRONE 0.35 MG PO TABS
1.0000 | ORAL_TABLET | Freq: Every day | ORAL | 3 refills | Status: DC
  Filled 2021-03-25: qty 28, 28d supply, fill #0

## 2021-03-25 MED ORDER — IBUPROFEN 600 MG PO TABS
600.0000 mg | ORAL_TABLET | Freq: Four times a day (QID) | ORAL | 0 refills | Status: DC | PRN
  Filled 2021-03-25: qty 30, 8d supply, fill #0

## 2021-03-25 MED ORDER — AMLODIPINE BESYLATE 5 MG PO TABS
5.0000 mg | ORAL_TABLET | Freq: Every day | ORAL | 1 refills | Status: DC
  Filled 2021-03-25: qty 30, 30d supply, fill #0

## 2021-03-25 NOTE — Lactation Note (Signed)
This note was copied from a baby's chart. Lactation Consultation Note  Patient Name: Diamond Forbes YIRSW'N Date: 03/25/2021 Reason for consult: Follow-up assessment Age:23 hours   Mother having questions about infants feeding cues. She reports that infant was showing feeding cues and when she latched her on she only suckled for a few times and then went to sleep.   Discussed that this is infants normal behavior when cluster feeding.  Discussed short little snacks are important for infant to build her milk supply and infants appetite.   Lots of teaching with parents.  Both very receptive. Discussed treatment and prevention of engorgement.  Continue cue base feeding and allow for cluster feeding.  Suggested OP visit for follow up.   Maternal Data    Feeding Mother's Current Feeding Choice: Breast Milk  LATCH Score                    Lactation Tools Discussed/Used    Interventions    Discharge Discharge Education: Engorgement and breast care;Warning signs for feeding baby;Outpatient recommendation Pump: Personal;Manual  Consult Status Consult Status: Complete    Michel Bickers 03/25/2021, 3:02 PM

## 2021-03-25 NOTE — Discharge Instructions (Signed)
Postpartum Hypertension Postpartum hypertension is high blood pressure that is higher than normal after childbirth. It usually starts within 1 to 2 days after delivery, but it can happen at any time for up to 6 weeks after delivery. For some women, medical treatment is required to prevent serious complications, such as seizures or stroke. What are the causes? The cause of this condition is not well understood. In some cases, the cause may not be known. Certain conditions may increase your risk. These include:  Hypertension that existed before pregnancy (chronic hypertension).  Hypertension that comes as a result of pregnancy (gestational hypertension).  Hypertensive disorders during pregnancy or seizures in women who have high blood pressure during pregnancy. These conditions are called preeclampsia and eclampsia.  A condition in which the liver, platelets, and red blood cells are damaged during pregnancy (HELLP syndrome).  Obesity.  Diabetes. What are the signs or symptoms? As with all types of hypertension, postpartum hypertension may not have any symptoms. Depending on how high your blood pressure is, you may experience:  Headaches. These may be mild, moderate, or severe. They may also be steady, constant, or sudden in onset (thunderclap headache).  Vision changes, such as blurry vision, flashing lights, or seeing spots.  Nausea and vomiting.  Pain in the upper right side of your abdomen.  Shortness of breath.  Difficulty breathing while lying down.  A decrease in the amount of urine that you pass. How is this diagnosed? This condition may be diagnosed based on the results of a physical exam, blood pressure measurements, and blood and urine tests. You may also have other tests, such as a CT scan or an MRI, to check for other problems of postpartum hypertension. How is this treated? If blood pressure is high enough to require treatment, your options may include:  Medicines to  reduce blood pressure (antihypertensives). Tell your health care provider if you are breastfeeding or if you plan to breastfeed. There are many antihypertensive medicines that are safe to take while breastfeeding.  Treating medical conditions that are causing hypertension.  Treating the complications of hypertension, such as seizures, stroke, or kidney problems. Your health care provider will also continue to monitor your blood pressure closely until it is within a safe range for you. Follow these instructions at home: Learn your goal blood pressure Two numbers make up your blood pressure. The first number is called systolic pressure. The second is called diastolic pressure. An example of a blood pressure reading is "120 over 80" (or 120/80). For most people, goal blood pressure is:  First number: below 140.  Second number: below 90. Your blood pressure is above normal even if only the top or bottom number is above normal. Know what to do before you take your blood pressure 30 minutes before you check your blood pressure:  Do not drink caffeine.  Do not drink alcohol.  Avoid food and drink.  Do not smoke.  Do not exercise. 5 minutes before you check your blood pressure:  Use the bathroom and urinate so that you have an empty bladder.  Sit quietly in a dining room chair. Do not sit in a soft couch or an armchair. Do not talk. Know how to take your blood pressure To check your blood pressure, follow the instructions in the manual that came with your blood pressure monitor. If you have a digital blood pressure monitor, the instructions may be as follows: 1. Sit up straight. 2. Place your feet on the floor. Do   not cross your ankles or legs. 3. Rest your left arm at the level of your heart. You may rest it on a table, desk, or chair. 4. Pull up your shirt sleeve. 5. Wrap the blood pressure cuff around the upper part of your left arm. The cuff should be 1 inch (2.5 cm) above your  elbow. It is best to wrap the cuff around bare skin. 6. Fit the cuff snugly around your arm. You should be able to place only one finger between the cuff and your arm. 7. Put the cord inside the groove of your elbow. 8. Press the power button. 9. Sit quietly while the cuff fills with air and loses air. 10. Write down the numbers on the screen. These are your blood pressure readings. 11. Wait 1-2 minutes and then repeat steps 1-10.   Record your blood pressure readings Follow your health care provider's instructions on how to record your blood pressure readings. If you were asked to use this form, follow these instructions:  Get one reading in the morning (a.m.) before you take any medicines.  Get one reading in the evening (p.m.) before supper.  Take at least 2 readings with each blood pressure check. This makes sure the results are correct. Wait 1-2 minutes between measurements.  Write down the results in the spaces on this form. Date: _______________________  a.m. _____________________(1st reading) _____________________(2nd reading)  p.m. _____________________(1st reading) _____________________(2nd reading) Date: _______________________  a.m. _____________________(1st reading) _____________________(2nd reading)  p.m. _____________________(1st reading) _____________________(2nd reading) Date: _______________________  a.m. _____________________(1st reading) _____________________(2nd reading)  p.m. _____________________(1st reading) _____________________(2nd reading) Date: _______________________  a.m. _____________________(1st reading) _____________________(2nd reading)  p.m. _____________________(1st reading) _____________________(2nd reading) Date: _______________________  a.m. _____________________(1st reading) _____________________(2nd reading)  p.m. _____________________(1st reading) _____________________(2nd reading) General instructions  Take over-the-counter and  prescription medicines only as told by your health care provider.  Do not use any products that contain nicotine or tobacco. These products include cigarettes, chewing tobacco, and vaping devices, such as e-cigarettes. If you need help quitting, ask your health care provider.  Check your blood pressure as often as recommended by your health care provider.  Return to your normal activities as told by your health care provider. Ask your health care provider what activities are safe for you.  Keep all follow-up visits. This is important. Contact a health care provider if:  You have new symptoms, such as: ? A headache that does not get better. ? Dizziness. ? Visual changes. ? Nausea and vomiting. Get help right away if:  You develop difficulty breathing.  You have chest pain.  You faint.  You have any symptoms of a stroke. "BE FAST" is an easy way to remember the main warning signs of a stroke: ? B - Balance. Signs are dizziness, sudden trouble walking, or loss of balance. ? E - Eyes. Signs are trouble seeing or a sudden change in vision. ? F - Face. Signs are sudden weakness or numbness of the face, or the face or eyelid drooping on one side. ? A - Arms. Signs are weakness or numbness in an arm. This happens suddenly and usually on one side of the body. ? S - Speech. Signs are sudden trouble speaking, slurred speech, or trouble understanding what people say. ? T - Time. Time to call emergency services. Write down what time symptoms started.  You have other signs of a stroke, such as: ? A sudden, severe headache with no known cause. ? Nausea or vomiting. ? Seizure. These symptoms   may represent a serious problem that is an emergency. Do not wait to see if the symptoms will go away. Get medical help right away. Call your local emergency services (911 in the [place] Do not drive yourself to the hospital. Summary  Postpartum hypertension is high blood pressure that remains higher than  normal after childbirth.  For some women, medical treatment is required to prevent serious complications, such as seizures or stroke.  Follow your health care provider's instructions on how to record your blood pressure readings.  Keep all follow-up visits. This is important. This information is not intended to replace advice given to you by your health care provider. Make sure you discuss any questions you have with your health care provider. Document Revised: 07/21/2020 Document Reviewed: 07/21/2020 Elsevier Patient Education  2021 [employer] Postpartum Care After Vaginal Delivery The following information offers guidance about how to care for yourself from the time you deliver your baby to 6-12 weeks after delivery (postpartum period). If you have problems or questions, contact your health care provider for more specific instructions. Follow these instructions at home: Vaginal bleeding  It is normal to have vaginal bleeding (lochia) after delivery. Wear a sanitary pad for bleeding and discharge. ? During the first week after delivery, the amount and appearance of lochia is often similar to a menstrual period. ? Over the next few weeks, it will gradually decrease to a dry, yellow-brown discharge. ? For most women, lochia stops completely by 4-6 weeks after delivery, but can vary.  Change your sanitary pads frequently. Watch for any changes in your flow, such as: ? A sudden increase in volume. ? A change in color. ? Large blood clots.  If you pass a blood clot from your vagina, save it and call your health care provider. Do not flush blood clots down the toilet before talking with your health care provider.  Do not use tampons or douches until your health care provider approves.  If you are not breastfeeding, your period should return 6-8 weeks after delivery. If you are feeding your baby breast milk only, your period may not return until you stop breastfeeding. Perineal  care  Keep the area between the vagina and the anus (perineum) clean and dry. Use medicated pads and pain-relieving sprays and creams as directed.  If you had a surgical cut in the perineum (episiotomy) or a tear, check the area for signs of infection until you are healed. Check for: ? More redness, swelling, or pain. ? Fluid or blood coming from the cut or tear. ? Warmth. ? Pus or a bad smell.  You may be given a squirt bottle to use instead of wiping to clean the perineum area after you use the bathroom. Pat the area gently to dry it.  To relieve pain caused by an episiotomy, a tear, or swollen veins in the anus (hemorrhoids), take a warm sitz bath 2-3 times a day. In a sitz bath, the warm water should only come up to your hips and cover your buttocks.   Breast care  In the first few days after delivery, your breasts may feel heavy, full, and uncomfortable (breast engorgement). Milk may also leak from your breasts. Ask your health care provider about ways to help relieve the discomfort.  If you are breastfeeding: ? Wear a bra that supports your breasts and fits well. Use breast pads to absorb milk that leaks. ? Keep your nipples clean and dry. Apply creams and ointments as told. ? You  may have uterine contractions every time you breastfeed for up to several weeks after delivery. This helps your uterus return to its normal size. ? If you have any problems with breastfeeding, notify your health care provider or lactation consultant.  If you are not breastfeeding: ? Avoid touching your breasts. Do not squeeze out (express) milk. Doing this can make your breasts produce more milk. ? Wear a good-fitting bra and use cold packs to help with swelling. Intimacy and sexuality  Ask your health care provider when you can engage in sexual activity. This may depend upon: ? Your risk of infection. ? How fast you are healing. ? Your comfort and desire to engage in sexual activity.  You are able to  get pregnant after delivery, even if you have not had your period. Talk with your health care provider about methods of birth control (contraception) or family planning if you desire future pregnancies. Medicines  Take over-the-counter and prescription medicines only as told by your health care provider.  Take an over-the-counter stool softener to help ease bowel movements as told by your health care provider.  If you were prescribed an antibiotic medicine, take it as told by your health care provider. Do not stop taking the antibiotic even if you start to feel better.  Review all previous and current prescriptions to check for possible transfer into breast milk. Activity  Gradually return to your normal activities as told by your health care provider.  Rest as much as possible. Nap while your baby is sleeping. Eating and drinking  Drink enough fluid to keep your urine pale yellow.  To help prevent or relieve constipation, eat high-fiber foods every day.  Choose healthy eating to support breastfeeding or weight loss goals.  Take your prenatal vitamins until your health care provider tells you to stop.   General tips/recommendations  Do not use any products that contain nicotine or tobacco. These products include cigarettes, chewing tobacco, and vaping devices, such as e-cigarettes. If you need help quitting, ask your health care provider.  Do not drink alcohol, especially if you are breastfeeding.  Do not take medications or drugs that are not prescribed to you, especially if you are breastfeeding.  Visit your health care provider for a postpartum checkup within the first 3-6 weeks after delivery.  Complete a comprehensive postpartum visit no later than 12 weeks after delivery.  Keep all follow-up visits for you and your baby. Contact a health care provider if:  You feel unusually sad or worried.  Your breasts become red, painful, or hard.  You have a fever or other signs  of an infection.  You have bleeding that is soaking through one pad an hour or you have blood clots.  You have a severe headache that doesn't go away or you have vision changes.  You have nausea and vomiting and are unable to eat or drink anything for 24 hours. Get help right away if:  You have chest pain or difficulty breathing.  You have sudden, severe leg pain.  You faint or have a seizure.  You have thoughts about hurting yourself or your baby. If you ever feel like you may hurt yourself or others, or have thoughts about taking your own life, get help right away. Go to your nearest emergency department or:  Call your local emergency services (911 in the [place]  The National Suicide Prevention Lifeline at 469-586-4966. This suicide crisis helpline is open 24 hours a day.  Text the Crisis Text  Line at 741741 (in the [place] Summary  The period of time after you deliver your newborn up to 6-12 weeks after delivery is called the postpartum period.  Keep all follow-up visits for you and your baby.  Review all previous and current prescriptions to check for possible transfer into breast milk.  Contact a health care provider if you feel unusually sad or worried during the postpartum period. This information is not intended to replace advice given to you by your health care provider. Make sure you discuss any questions you have with your health care provider. Document Revised: 07/09/2020 Document Reviewed: 07/09/2020 Elsevier Patient Education  2021 Elsevier Inc.  

## 2021-03-30 ENCOUNTER — Other Ambulatory Visit: Payer: Self-pay

## 2021-03-30 ENCOUNTER — Ambulatory Visit (INDEPENDENT_AMBULATORY_CARE_PROVIDER_SITE_OTHER): Payer: Medicaid Other

## 2021-03-30 VITALS — BP 126/83 | HR 92 | Wt 167.0 lb

## 2021-03-30 DIAGNOSIS — Z0130 Encounter for examination of blood pressure without abnormal findings: Secondary | ICD-10-CM

## 2021-03-30 NOTE — Progress Notes (Addendum)
Subjective:  Diamond Forbes is a 25 y.o. female here for BP check.   Hypertension ROS: taking medications as instructed, no medication side effects noted, no TIA's, no chest pain on exertion, no dyspnea on exertion and no swelling of ankles.    Objective:  There were no vitals taken for this visit.  Appearance alert, well appearing, and in no distress. General exam BP:126/83  noted to be well controlled today in office.    Assessment:   Blood Pressure well controlled.   Plan:  Current treatment plan is effective, no change in therapy.Pt will f/u at [facility] visit.

## 2021-03-31 ENCOUNTER — Other Ambulatory Visit: Payer: Medicaid Other

## 2021-03-31 NOTE — Progress Notes (Signed)
Chart reviewed for nurse visit. Agree with plan of care.   Marny Lowenstein, PA-C 03/31/2021 11:29 AM

## 2021-04-01 ENCOUNTER — Encounter: Payer: Medicaid Other | Admitting: Obstetrics & Gynecology

## 2021-04-05 IMAGING — US US MFM OB COMP +14 WKS
1 series · 14 of 28 positions shown · non-contrast
Comparison: none

[Series 1: us mfm ob comp +14 wks · 69 acquisitions, 14 frames shown]
[im 3/69]
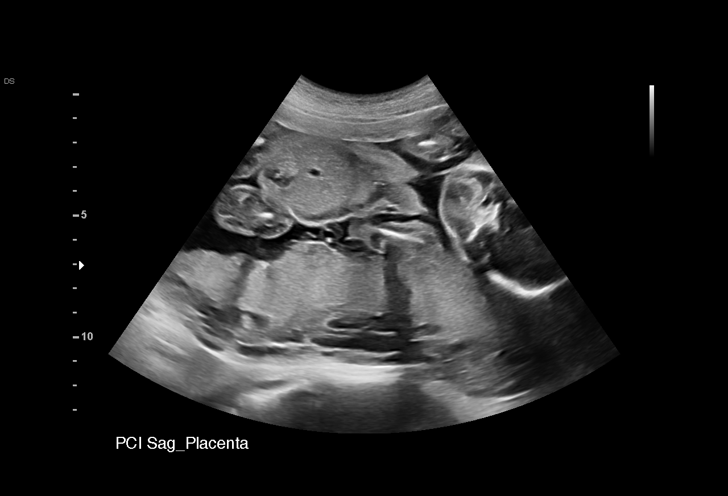
[im 8/69]
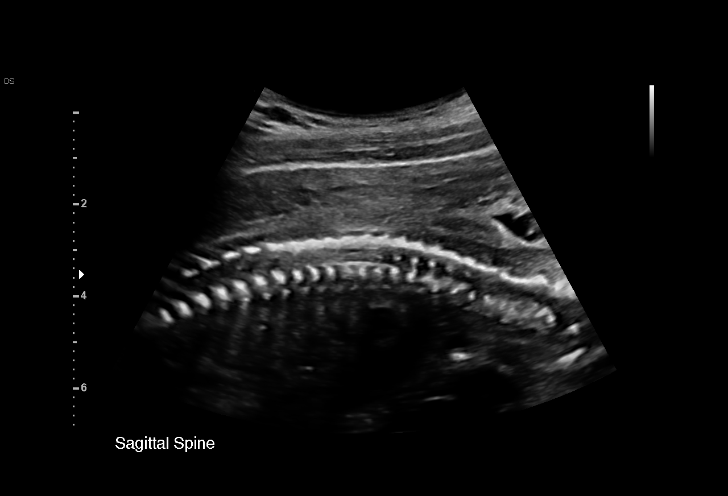
[im 13/69]
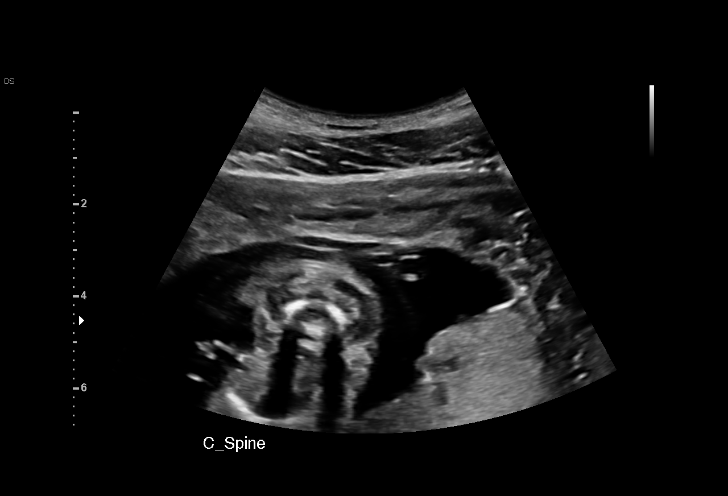
[im 18/69]
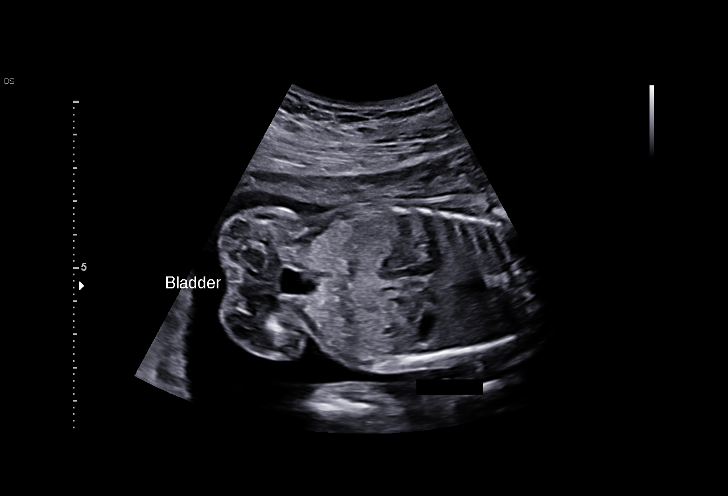
[im 23/69]
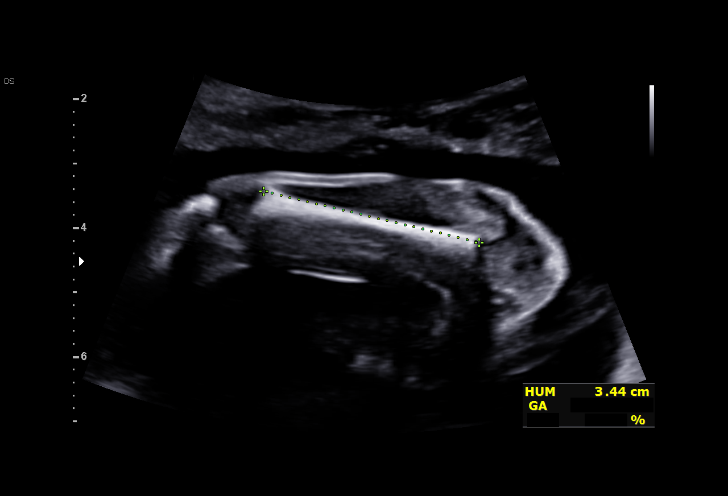
[im 28/69]
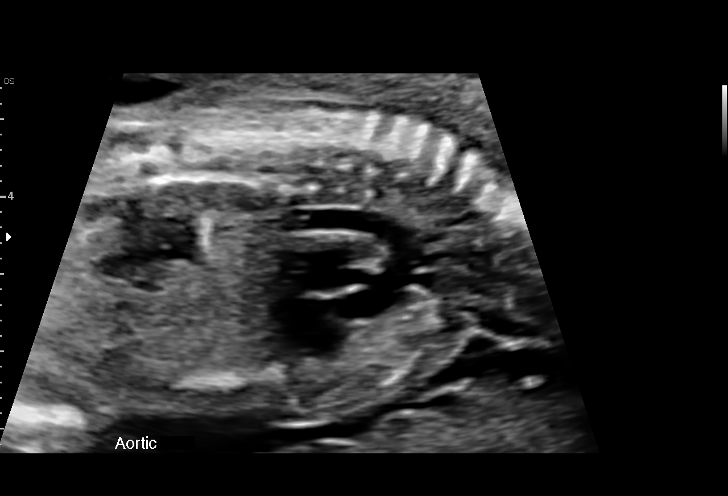
[im 33/69]
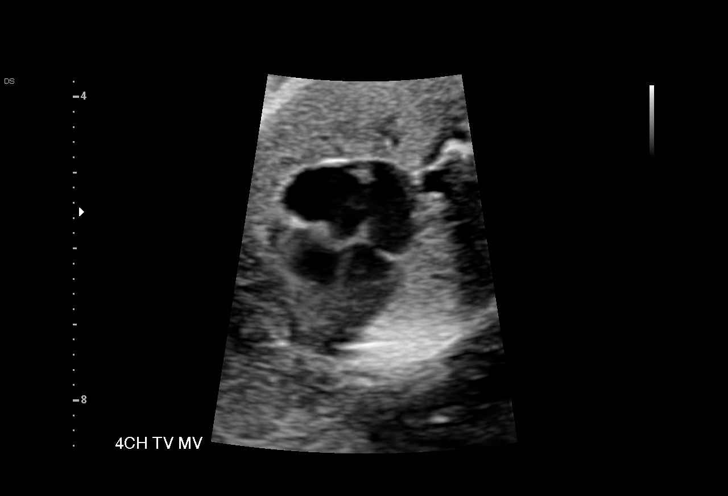
[im 38/69]
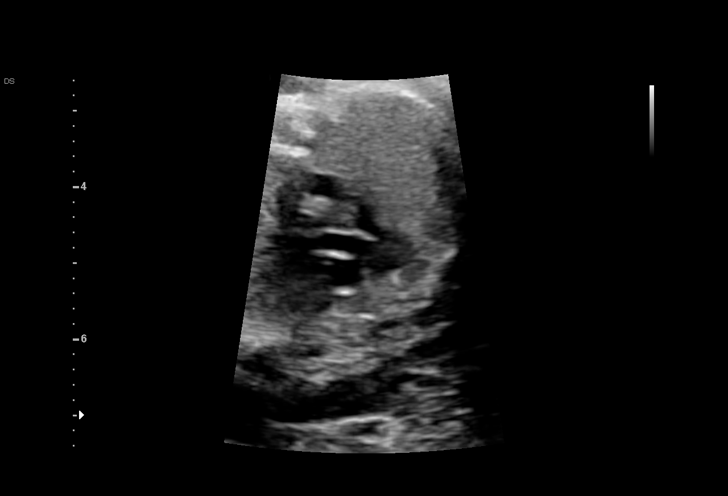
[im 43/69]
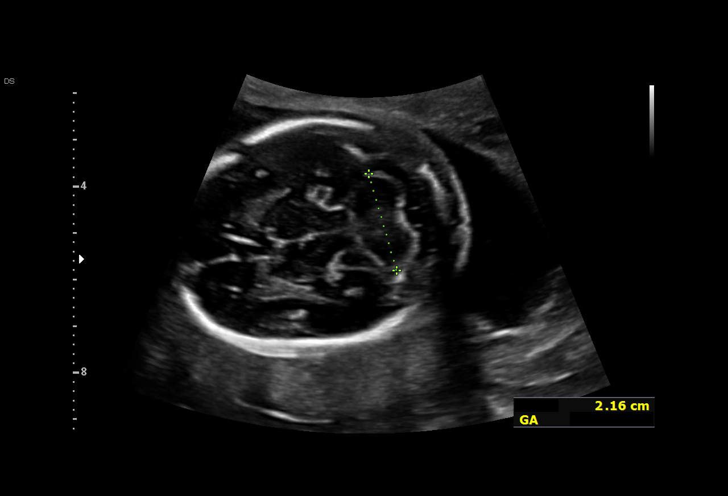
[im 48/69]
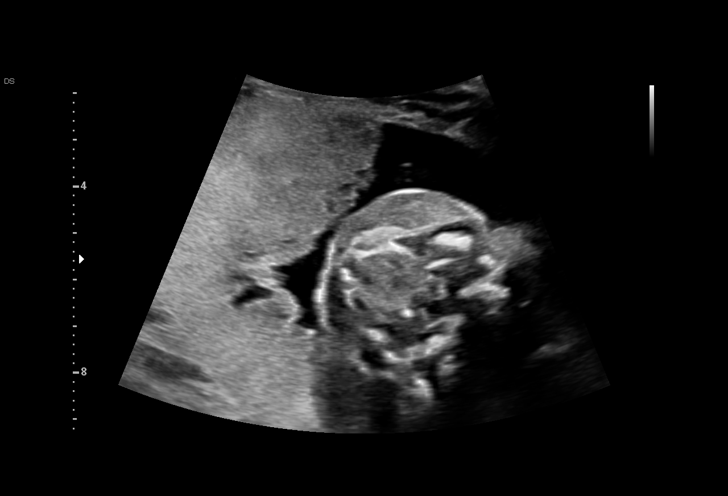
[im 53/69]
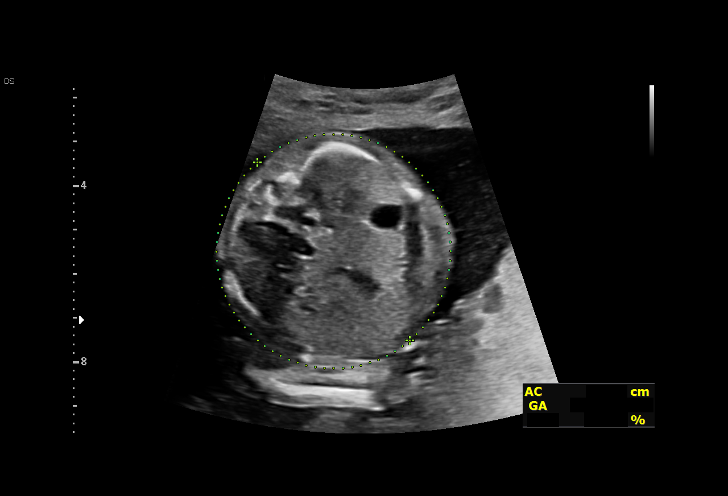
[im 58/69]
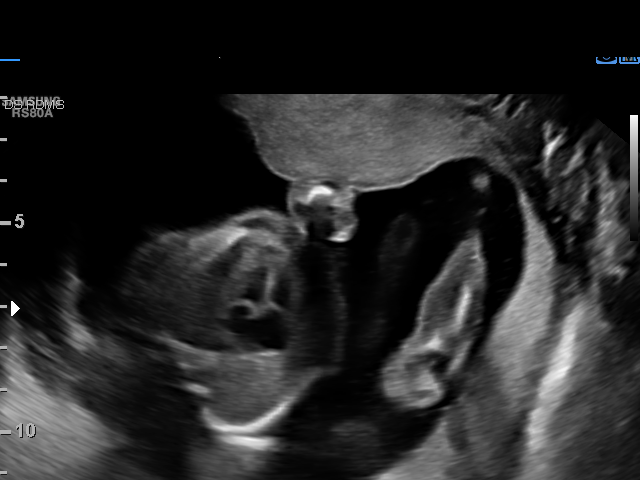
[im 63/69]
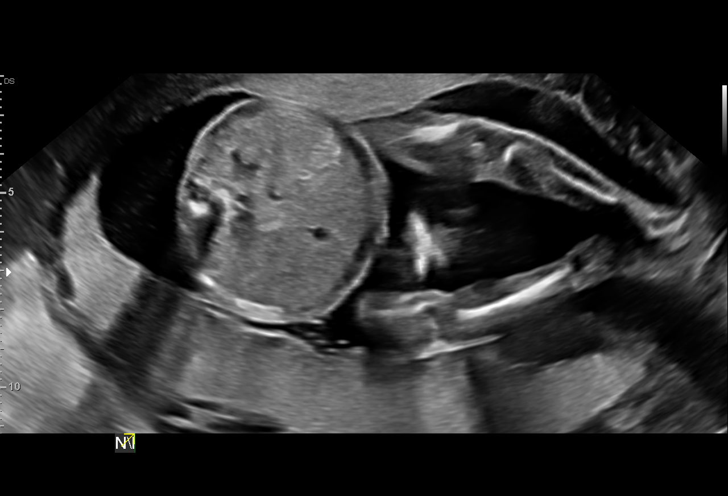
[im 69/69]
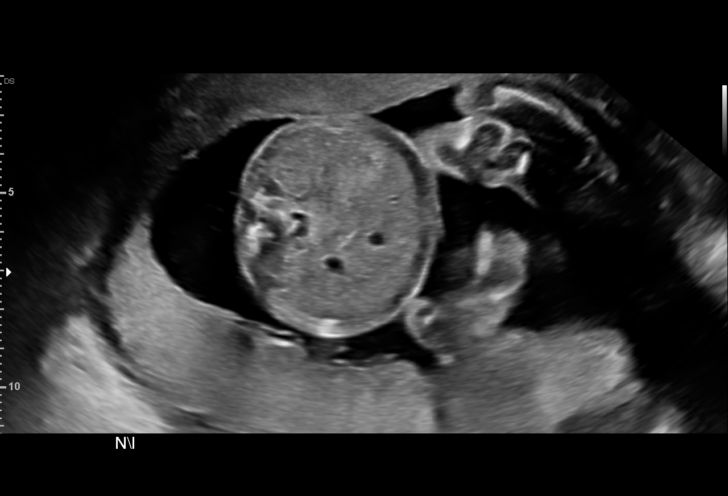

[14 of 28 positions shown; findings below may reference images not displayed]

XAQAN

Indications

 21 weeks gestation of pregnancy
 Encounter for antenatal screening for
 malformations
Fetal Evaluation

 Num Of Fetuses:         1
 Cardiac Activity:       Observed
 Presentation:           Cephalic
 Placenta:               Posterior
 P. Cord Insertion:      Visualized, central

 Amniotic Fluid
 AFI FV:      Within normal limits
Biometry

 BPD:      48.1  mm     G. Age:  20w 4d         13  %    CI:        71.19   %    70 - 86
                                                         FL/HC:      19.7   %    18.4 -
 HC:      181.6  mm     G. Age:  20w 4d          7  %    HC/AC:      1.10        1.06 -
 AC:      165.7  mm     G. Age:  21w 4d         44  %    FL/BPD:     74.4   %    71 - 87
 FL:       35.8  mm     G. Age:  21w 2d         32  %    FL/AC:      21.6   %    20 - 24
 HUM:      34.2  mm     G. Age:  21w 5d         51  %
 CER:      21.6  mm     G. Age:  20w 3d         28  %
 LV:        6.3  mm

 Est. FW:     418  gm    0 lb 15 oz      33  %
OB History

 Gravidity:    2         Term:   0         SAB:   1
 Living:       0
Gestational Age

 LMP:           21w 4d        Date:  06/29/20                 EDD:   04/05/21
 U/S Today:     21w 0d                                        EDD:   04/09/21
 Best:          21w 4d     Det. By:  LMP  (06/29/20)          EDD:   04/05/21
Anatomy

 Cranium:               Appears normal         LVOT:                   Appears normal
 Cavum:                 Appears normal         Aortic Arch:            Appears normal
 Ventricles:            Appears normal         Ductal Arch:            Appears normal
 Choroid Plexus:        Appears normal         Diaphragm:              Appears normal
 Cerebellum:            Appears normal         Stomach:                Appears normal, left
                                                                       sided
 Posterior Fossa:       Appears normal         Abdomen:                Appears normal
 Nuchal Fold:           Not applicable (>20    Abdominal Wall:         Appears nml (cord
                        wks GA)                                        insert, abd wall)
 Face:                  Appears normal         Cord Vessels:           Appears normal (3
                        (orbits and profile)                           vessel cord)
 Lips:                  Appears normal         Kidneys:                Appear normal
 Palate:                Not well visualized    Bladder:                Appears normal
 Thoracic:              Appears normal         Spine:                  Appears normal
 Heart:                 Appears normal         Upper Extremities:      Appears normal
                        (4CH, axis, and
                        situs)
 RVOT:                  Appears normal         Lower Extremities:      Appears normal

 Other:  Fetus appears to be female. Nasal bone visualized. Heels/feet and
         open hands/5th digits visualized. VC, 3VV and 3VTV
         visualized.Nose/Lips views suboptimal due to fetal position.
Cervix Uterus Adnexa

 Cervix
 Length:              3  cm.
 Normal appearance by transabdominal scan.

 Right Ovary
 Within normal limits.

 Left Ovary
 Within normal limits.
Impression

 Single intrauterine pregnancy here for a detailed anatomy
 Normal anatomy with measurements consistent with dates
 There is good fetal movement and amniotic fluid volume
Recommendations

 Follow up growth as clinically indicated.

## 2021-05-06 ENCOUNTER — Other Ambulatory Visit: Payer: Self-pay

## 2021-05-06 ENCOUNTER — Ambulatory Visit (INDEPENDENT_AMBULATORY_CARE_PROVIDER_SITE_OTHER): Payer: Medicaid Other | Admitting: Family Medicine

## 2021-05-06 ENCOUNTER — Other Ambulatory Visit (HOSPITAL_COMMUNITY)
Admission: RE | Admit: 2021-05-06 | Discharge: 2021-05-06 | Disposition: A | Discharge status: Home / Self Care | Payer: Medicaid Other | Source: Ambulatory Visit | Attending: Family Medicine | Admitting: Family Medicine

## 2021-05-06 DIAGNOSIS — N898 Other specified noninflammatory disorders of vagina: Secondary | ICD-10-CM | POA: Diagnosis present

## 2021-05-06 DIAGNOSIS — O24410 Gestational diabetes mellitus in pregnancy, diet controlled: Secondary | ICD-10-CM | POA: Diagnosis not present

## 2021-05-06 DIAGNOSIS — O165 Unspecified maternal hypertension, complicating the puerperium: Secondary | ICD-10-CM | POA: Diagnosis not present

## 2021-05-06 MED ORDER — NORETHINDRONE 0.35 MG PO TABS: 1.0000 | ORAL_TABLET | Freq: Every day | ORAL | 3 refills | Status: AC

## 2021-05-06 NOTE — Progress Notes (Signed)
Post Partum Visit Note  Diamond Forbes is a 25 y.o. G27P1011 female who presents for a postpartum visit. She is 6 weeks postpartum following a normal spontaneous vaginal delivery.  I have fully reviewed the prenatal and intrapartum course. The delivery was at 38 gestational weeks.  Anesthesia: epidural. Postpartum course has been normal. Baby is doing well. Baby is feeding by breast. Bleeding no bleeding. Bowel function is normal. Bladder function is normal. Patient is sexually active. Contraception method is oral progesterone-only contraceptive. Postpartum depression screening: negative.    Edinburgh Postnatal Depression Scale - 05/06/21 1100       Edinburgh Postnatal Depression Scale:  In the Past 7 Days   I have been able to laugh and see the funny side of things. 0    I have looked forward with enjoyment to things. 0    I have blamed myself unnecessarily when things went wrong. 1    I have been anxious or worried for no good reason. 1    I have felt scared or panicky for no good reason. 0    Things have been getting on top of me. 3    I have been so unhappy that I have had difficulty sleeping. 1    I have felt sad or miserable. 1    I have been so unhappy that I have been crying. 0    The thought of harming myself has occurred to me. 0    Edinburgh Postnatal Depression Scale Total 7             Health Maintenance Due  Topic Date Due   URINE MICROALBUMIN  Never done   HPV VACCINES (1 - 2-dose series) Never done   COVID-19 Vaccine (2 - Pfizer series) 12/04/2020    The following portions of the patient's history were reviewed and updated as appropriate: allergies, current medications, past family history, past medical history, past social history, past surgical history, and problem list.  Review of Systems Pertinent items are noted in HPI.  Objective:  BP 116/79   Pulse 69   Ht 5\' 5"  (1.651 m)   Wt 159 lb (72.1 kg)   Breastfeeding Yes   BMI 26.46 kg/m     General:  alert, cooperative, and no distress  Lungs: clear to auscultation bilaterally  Heart:  regular rate and rhythm, S1, S2 normal, no murmur, click, rub or gallop  Abdomen: soft, non-tender; bowel sounds normal; no masses,  no organomegaly   Wound N/a  GU exam:   Thick white discharge , perineum well healed       Assessment:     1. Postpartum care and examination   2. Vaginal discharge   3. Postpartum hypertension   4. Diet controlled gestational diabetes mellitus (GDM) in third trimester       Plan:   Essential components of care per ACOG recommendations:  1.  Mood and well being: Patient with negative depression screening today. Reviewed local resources for support.  - Patient tobacco use? No.   - hx of drug use? No.    2. Infant care and feeding:  -Patient currently breastmilk feeding? Yes. Reviewed importance of draining breast regularly to support lactation.  -Social determinants of health (SDOH) reviewed in EPIC. No concerns  3. Sexuality, contraception and birth spacing - Patient does not want a pregnancy in the next year.   - Reviewed forms of contraception in tiered fashion. Patient desired oral progesterone-only contraceptive today.   - Discussed  birth spacing of 18 months  4. Sleep and fatigue -Encouraged family/partner/community support of 4 hrs of uninterrupted sleep to help with mood and fatigue  5. Physical Recovery  - Discussed patients delivery and complications. She describes her labor as good. - Patient had a Vaginal, no problems at delivery. Patient had a 1st degree laceration. Perineal healing reviewed. Patient expressed understanding - Patient has urinary incontinence? No. - Patient is safe to resume physical and sexual activity  6.  Health Maintenance - HM due items addressed No - n/a - Last pap smear  Diagnosis  Date Value Ref Range Status  09/17/2020   Final   - Negative for intraepithelial lesion or malignancy (NILM)   Pap smear  not done at today's visit.  -Breast Cancer screening indicated? No.   7. Chronic Disease/Pregnancy Condition follow up:  2hr GTT  - PCP follow up  Levie Heritage, DO [facility], [facility]

## 2021-05-07 LAB — CERVICOVAGINAL ANCILLARY ONLY
Bacterial Vaginitis (gardnerella): NEGATIVE
Candida Glabrata: NEGATIVE
Candida Vaginitis: NEGATIVE
Comment: NEGATIVE
Comment: NEGATIVE
Comment: NEGATIVE

## 2021-05-14 ENCOUNTER — Ambulatory Visit: Payer: Medicaid Other

## 2021-05-17 ENCOUNTER — Ambulatory Visit: Payer: Medicaid Other

## 2021-05-21 ENCOUNTER — Telehealth: Payer: Self-pay | Admitting: Emergency Medicine

## 2021-05-21 ENCOUNTER — Ambulatory Visit: Payer: Medicaid Other

## 2021-05-21 NOTE — Telephone Encounter (Signed)
   Diamond Forbes DOB: [birth year] MRN: [id number]   RIDER WAIVER AND RELEASE OF LIABILITY  For purposes of improving physical access to our facilities, [facility] is pleased to partner with third parties to provide [place] patients or other authorized individuals the option of convenient, on-demand ground transportation services (the [employer]") through use of the technology service that enables users to request on-demand ground transportation from independent third-party providers.  By opting to use and accept these [employer], I, the undersigned, hereby agree on behalf of myself, and on behalf of any minor child using the Science writer for whom I am the parent or legal guardian, as follows:  Science writer provided to me are provided by independent third-party transportation providers who are not [employer] or employees and who are unaffiliated with [employer]. [facility] is neither a transportation carrier nor a common or public carrier. [facility] has no control over the quality or safety of the transportation that occurs as a result of the [employer]. [facility] cannot guarantee that any third-party transportation provider will complete any arranged transportation service. [facility] makes no representation, warranty, or guarantee regarding the reliability, timeliness, quality, safety, suitability, or availability of any of the Transport Services or that they will be error free. I fully understand that traveling by vehicle involves risks and dangers of serious bodily injury, including permanent disability, paralysis, and death. I agree, on behalf of myself and on behalf of any minor child using the Transport Services for whom I am the parent or legal guardian, that the entire risk arising out of my use of the [employer] remains solely with me, to the maximum extent permitted under applicable law. The [employer] are  provided "as is" and "as available." [facility] disclaims all representations and warranties, express, implied or statutory, not expressly set out in these terms, including the implied warranties of merchantability and fitness for a particular purpose. I hereby waive and release [facility], its agents, employees, officers, directors, representatives, insurers, attorneys, assigns, successors, subsidiaries, and affiliates from any and all past, present, or future claims, demands, liabilities, actions, causes of action, or suits of any kind directly or indirectly arising from acceptance and use of the [employer]. I further waive and release [facility] and its affiliates from all present and future liability and responsibility for any injury or death to persons or damages to property caused by or related to the use of the [employer]. I have read this Waiver and Release of Liability, and I understand the terms used in it and their legal significance. This Waiver is freely and voluntarily given with the understanding that my right (as well as the right of any minor child for whom I am the parent or legal guardian using the [employer]) to legal recourse against [facility] in connection with the [employer] is knowingly surrendered in return for use of these services.   I attest that I read the consent document to Diamond Forbes, gave Diamond Forbes the opportunity to ask questions and answered the questions asked (if any). I affirm that Diamond Forbes then provided consent for she's participation in this program.     Evette Doffing

## 2021-05-28 ENCOUNTER — Other Ambulatory Visit: Payer: Self-pay

## 2021-05-28 ENCOUNTER — Ambulatory Visit (INDEPENDENT_AMBULATORY_CARE_PROVIDER_SITE_OTHER): Payer: Medicaid Other

## 2021-05-28 DIAGNOSIS — Z8632 Personal history of gestational diabetes: Secondary | ICD-10-CM

## 2021-05-28 NOTE — Progress Notes (Signed)
Chart reviewed - agree with CMA/RN documentation.  ° °

## 2021-05-28 NOTE — Progress Notes (Deleted)
2 hour 

## 2021-05-29 LAB — GLUCOSE TOLERANCE, 2 HOURS
Glucose, 2 hour: 118 mg/dL (ref 65–139)
Glucose, GTT - Fasting: 83 mg/dL (ref 65–99)
